# Patient Record
Sex: Male | Born: 1935 | Race: White | Hispanic: No | Marital: Married | Smoking: Former smoker
Health system: Southern US, Community
[De-identification: ages and names within clinical notes are randomized; demographics above are authoritative.]

## PROBLEM LIST (undated history)

## (undated) DIAGNOSIS — M1990 Unspecified osteoarthritis, unspecified site: Secondary | ICD-10-CM

## (undated) DIAGNOSIS — E7800 Pure hypercholesterolemia, unspecified: Secondary | ICD-10-CM

## (undated) DIAGNOSIS — K219 Gastro-esophageal reflux disease without esophagitis: Secondary | ICD-10-CM

## (undated) DIAGNOSIS — I10 Essential (primary) hypertension: Secondary | ICD-10-CM

## (undated) DIAGNOSIS — I639 Cerebral infarction, unspecified: Secondary | ICD-10-CM

## (undated) HISTORY — PX: EYE SURGERY: SHX253

## (undated) HISTORY — PX: COLONOSCOPY: SHX174

## (undated) HISTORY — PX: TONSILLECTOMY: SUR1361

---

## 1997-12-05 ENCOUNTER — Inpatient Hospital Stay (HOSPITAL_COMMUNITY): Admission: EM | Admit: 1997-12-05 | Discharge: 1997-12-07 | Payer: Self-pay | Admitting: Emergency Medicine

## 2008-08-09 ENCOUNTER — Ambulatory Visit: Payer: Self-pay | Admitting: Unknown Physician Specialty

## 2011-06-24 ENCOUNTER — Emergency Department (HOSPITAL_COMMUNITY)
Admission: EM | Admit: 2011-06-24 | Discharge: 2011-06-25 | Disposition: A | Discharge status: Home / Self Care | Payer: Medicare HMO | Attending: Emergency Medicine | Admitting: Emergency Medicine

## 2011-06-24 ENCOUNTER — Emergency Department (HOSPITAL_COMMUNITY): Payer: Medicare HMO

## 2011-06-24 ENCOUNTER — Encounter (HOSPITAL_COMMUNITY): Payer: Self-pay

## 2011-06-24 DIAGNOSIS — H538 Other visual disturbances: Secondary | ICD-10-CM | POA: Insufficient documentation

## 2011-06-24 DIAGNOSIS — Z79899 Other long term (current) drug therapy: Secondary | ICD-10-CM | POA: Insufficient documentation

## 2011-06-24 DIAGNOSIS — R209 Unspecified disturbances of skin sensation: Secondary | ICD-10-CM | POA: Insufficient documentation

## 2011-06-24 DIAGNOSIS — R200 Anesthesia of skin: Secondary | ICD-10-CM

## 2011-06-24 DIAGNOSIS — I10 Essential (primary) hypertension: Secondary | ICD-10-CM | POA: Insufficient documentation

## 2011-06-24 DIAGNOSIS — Z7982 Long term (current) use of aspirin: Secondary | ICD-10-CM | POA: Insufficient documentation

## 2011-06-24 HISTORY — DX: Essential (primary) hypertension: I10

## 2011-06-24 LAB — POCT I-STAT, CHEM 8
BUN: 30 mg/dL — ABNORMAL HIGH (ref 6–23)
Creatinine, Ser: 1.5 mg/dL — ABNORMAL HIGH (ref 0.50–1.35)
Hemoglobin: 12.9 g/dL — ABNORMAL LOW (ref 13.0–17.0)
Potassium: 4.5 mEq/L (ref 3.5–5.1)
Sodium: 144 mEq/L (ref 135–145)
TCO2: 26 mmol/L (ref 0–100)

## 2011-06-24 NOTE — ED Notes (Signed)
Pt from home with complaints of left arm numbness and tingling lasting 1 minute or less. Pt states dizziness and generalized weakness lasting appx 2 minutes with both episodes happens at the same time. Pt states having blurred vision every morning lasting 30 secs x last 2-3 days. Pt denies any pain, numbness, tingling, shortness of breath, dizziness or blurred vision at this time.

## 2011-06-24 NOTE — ED Provider Notes (Addendum)
History     CSN: [id number]  Arrival date & time 06/24/11  2126   First MD Initiated Contact with Patient 06/24/11 2140      Chief Complaint  Patient presents with  . Numbness    Left arm numbness and tingling     HPI Pt from home with complaints of left arm numbness and tingling lasting 1 minute or less. Pt states dizziness and generalized weakness lasting appx 2 minutes with both episodes happens at the same time. Pt states having blurred vision every morning lasting 30 secs x last 2-3 days. Pt denies any pain, numbness, tingling, shortness of breath, dizziness or blurred vision at this time  Past Medical History  Diagnosis Date  . Hypertension     No past surgical history on file.  No family history on file.  History  Substance Use Topics  . Smoking status: Not on file  . Smokeless tobacco: Not on file  . Alcohol Use:       Review of Systems  All other systems reviewed and are negative.    Allergies  Review of patient's allergies indicates no known allergies.  Home Medications   Current Outpatient Rx  Name Route Sig Dispense Refill  . RISAQUAD PO CAPS Oral Take 1 capsule by mouth daily.    Marland Kitchen AMLODIPINE BESYLATE 5 MG PO TABS Oral Take 5 mg by mouth daily.    . ASPIRIN 325 MG PO TABS Oral Take 650 mg by mouth once.    Marland Kitchen LORATADINE 10 MG PO TABS Oral Take 10 mg by mouth daily.    Marland Kitchen OMEPRAZOLE 20 MG PO CPDR Oral Take 20 mg by mouth daily.      BP 152/67  Pulse 74  Temp(Src) 98.7 F (37.1 C) (Oral)  Resp 23  SpO2 97%  Physical Exam  Nursing note and vitals reviewed. Constitutional: He is oriented to person, place, and time. He appears well-developed and well-nourished. No distress.  HENT:  Head: Normocephalic and atraumatic.  Eyes: Pupils are equal, round, and reactive to light.  Neck: Normal range of motion.  Cardiovascular: Normal rate, normal heart sounds and intact distal pulses.   No murmur heard.      Normal sinus rhythm Rate = 68 Axis is  normal QTC 417 Interpretation: Normal EKG  Pulmonary/Chest: No respiratory distress.  Abdominal: Normal appearance. He exhibits no distension.  Musculoskeletal: Normal range of motion.  Neurological: He is alert and oriented to person, place, and time. He has normal strength. No cranial nerve deficit or sensory deficit. Coordination normal. GCS eye subscore is 4. GCS verbal subscore is 5. GCS motor subscore is 6.  Skin: Skin is warm and dry. No rash noted.  Psychiatric: He has a normal mood and affect. His behavior is normal.    ED Course  Procedures (including critical care time)  Labs Reviewed  POCT I-STAT, CHEM 8 - Abnormal; Notable for the following:    BUN 30 (*)    Creatinine, Ser 1.50 (*)    Glucose, Bld 109 (*)    Hemoglobin 12.9 (*)    HCT 38.0 (*)    All other components within normal limits   Ct Head Wo Contrast  06/24/2011  *RADIOLOGY REPORT*  Clinical Data: Left-sided weakness, head pain.  CT HEAD WITHOUT CONTRAST  Technique:  Contiguous axial images were obtained from the base of the skull through the vertex without contrast.  Comparison: None.  Findings: Prominence of the sulci, cisterns, and ventricles, in keeping with volume  loss. There are subcortical and periventricular white matter hypodensities, a nonspecific finding most often seen with chronic microangiopathic changes.  There is no evidence for acute hemorrhage, overt hydrocephalus, mass lesion, or abnormal extra-axial fluid collection.  No definite CT evidence for acute cortical based (large artery) infarction. The visualized paranasal sinuses and mastoid air cells are predominately clear.  IMPRESSION: Volume loss and mild white matter changes.  No definite acute intracranial abnormality.  Original Report Authenticated By: Waneta Martins, M.D.     1. Arm numbness       MDM  After treatment in the ED the patient feels back to baseline and wants to go home.         Nelia Shi, MD 06/24/11  1914  Nelia Shi, MD 06/24/11 [phone]

## 2011-06-24 NOTE — Discharge Instructions (Signed)
Paresthesia Paresthesia is a burning or prickling feeling. This feeling can happen in any part of the body. It often happens in the hands, arms, legs, or feet. HOME CARE  Avoid drinking alcohol.   Try massage or needle therapy (acupuncture) to help with your problems.   Keep all doctor visits as told.  GET HELP RIGHT AWAY IF:   You feel weak.   You have trouble walking or moving.   You have problems speaking or seeing.   You feel confused.   You cannot control when you poop (bowel movement) or pee (urinate).   You lose feeling (numbness) after an injury.   You pass out (faint).   Your burning or prickling feeling gets worse when you walk.   You have pain, cramps, or feel dizzy.   You have a rash.  MAKE SURE YOU:   Understand these instructions.   Will watch your condition.   Will get help right away if you are not doing well or get worse.  Document Released: 12/13/2007 Document Revised: 12/19/2010 Document Reviewed: 09/20/2010 [employer] Patient Information 2012 [employer]. 

## 2011-08-14 ENCOUNTER — Inpatient Hospital Stay: Payer: Self-pay | Admitting: Internal Medicine

## 2011-08-14 LAB — CBC
HCT: 38.9 % — ABNORMAL LOW (ref 40.0–52.0)
HGB: 13 g/dL (ref 13.0–18.0)
MCHC: 33.4 g/dL (ref 32.0–36.0)
MCV: 96 fL (ref 80–100)
RDW: 14.4 % (ref 11.5–14.5)

## 2011-08-14 LAB — MAGNESIUM: Magnesium: 2 mg/dL

## 2011-08-14 LAB — COMPREHENSIVE METABOLIC PANEL
Alkaline Phosphatase: 109 U/L (ref 50–136)
Calcium, Total: 8.9 mg/dL (ref 8.5–10.1)
Co2: 28 mmol/L (ref 21–32)
EGFR (Non-African Amer.): 51 — ABNORMAL LOW
Osmolality: 284 (ref 275–301)
Sodium: 140 mmol/L (ref 136–145)

## 2011-08-14 LAB — APTT: Activated PTT: 32.6 secs (ref 23.6–35.9)

## 2011-08-14 LAB — TROPONIN I: Troponin-I: <0.02 ng/mL

## 2011-08-15 LAB — CBC WITH DIFFERENTIAL/PLATELET
Basophil #: 0.1 10*3/uL (ref 0.0–0.1)
Eosinophil #: 0.2 10*3/uL (ref 0.0–0.7)
Lymphocyte #: 2.4 10*3/uL (ref 1.0–3.6)
MCH: 32.6 pg (ref 26.0–34.0)
MCV: 95 fL (ref 80–100)
Monocyte #: 0.5 x10 3/mm (ref 0.2–1.0)
Monocyte %: 9.2 %
Platelet: 232 10*3/uL (ref 150–440)
RDW: 14.8 % — ABNORMAL HIGH (ref 11.5–14.5)
WBC: 5.7 10*3/uL (ref 3.8–10.6)

## 2011-08-15 LAB — LIPID PANEL
Cholesterol: 167 mg/dL (ref 0–200)
HDL Cholesterol: 56 mg/dL (ref 40–60)
VLDL Cholesterol, Calc: 20 mg/dL (ref 5–40)

## 2011-08-15 LAB — BASIC METABOLIC PANEL
BUN: 18 mg/dL (ref 7–18)
Creatinine: 1.28 mg/dL (ref 0.60–1.30)
EGFR (African American): >60
Sodium: 141 mmol/L (ref 136–145)

## 2013-07-22 IMAGING — US US CAROTID DUPLEX BILAT
1 series · 14 of 24 positions shown · non-contrast
Comparison: none

REASON FOR EXAM: acute cva
COMMENTS:

[Series 1: us carotid duplex bilat · 0.07mm/px · 14 of 65 slices shown]
[im 1/65]
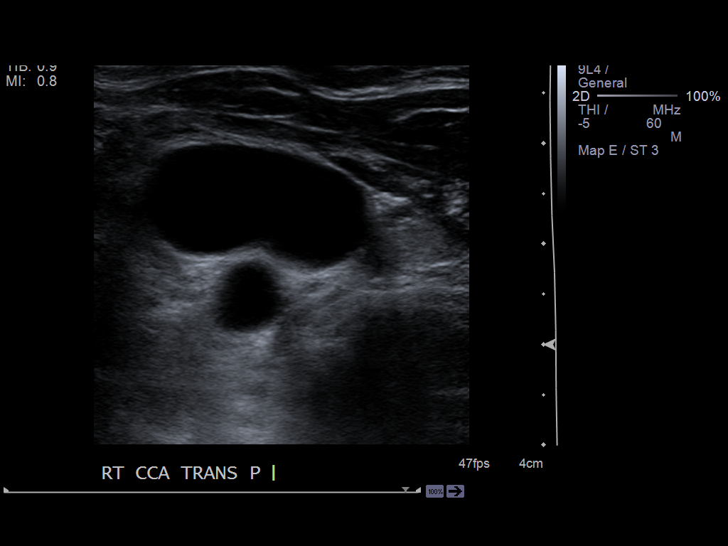
[im 6/65]
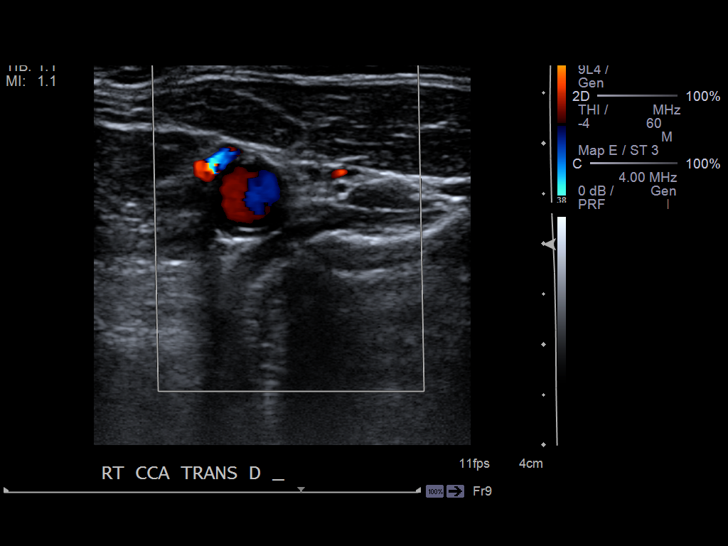
[im 12/65]
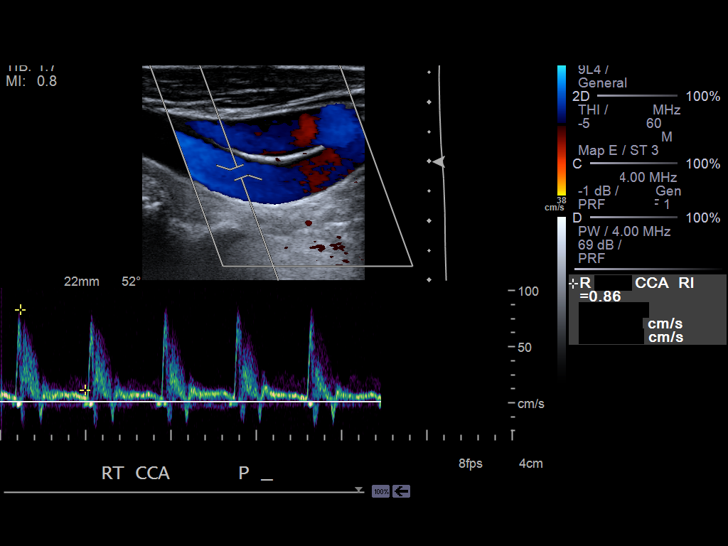
[im 17/65]
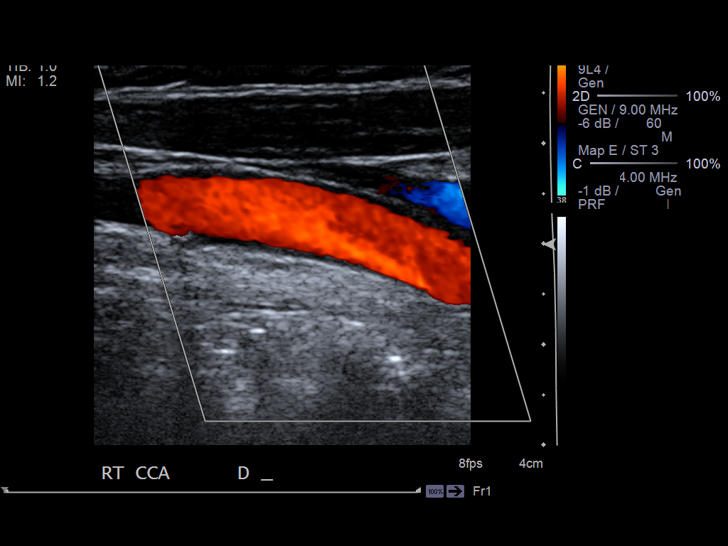
[im 20/65]
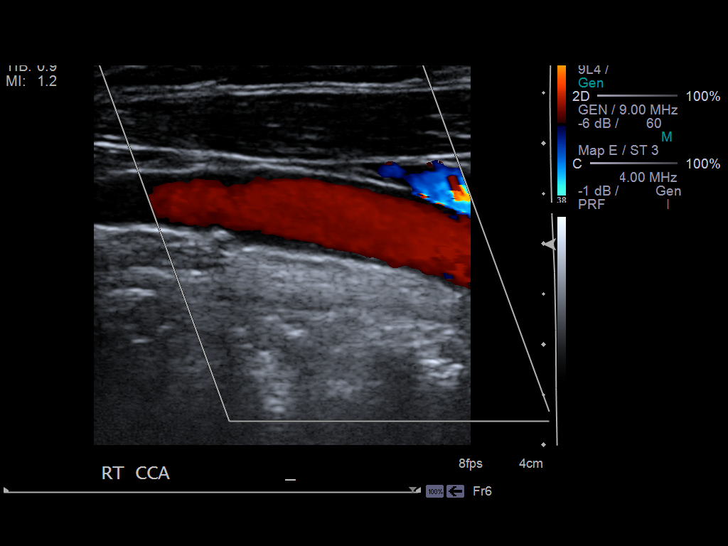
[im 26/65]
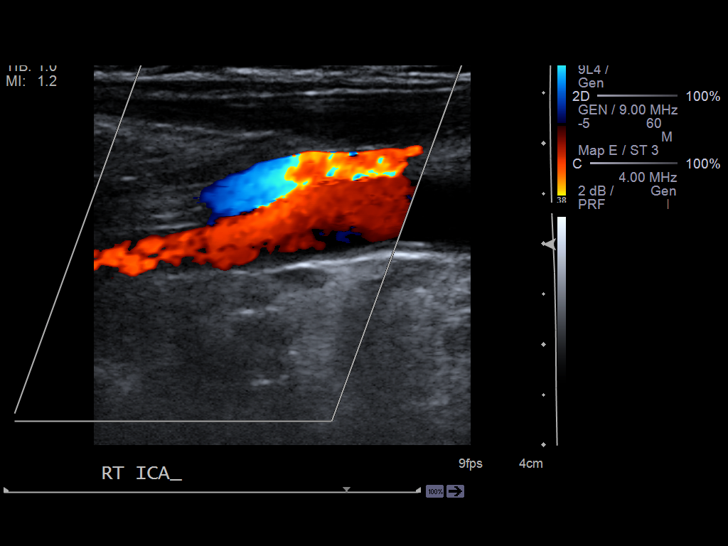
[im 31/65]
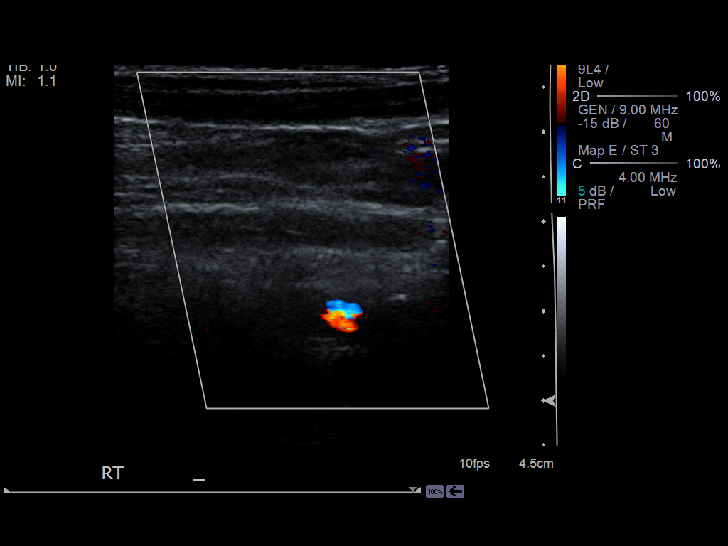
[im 34/65]
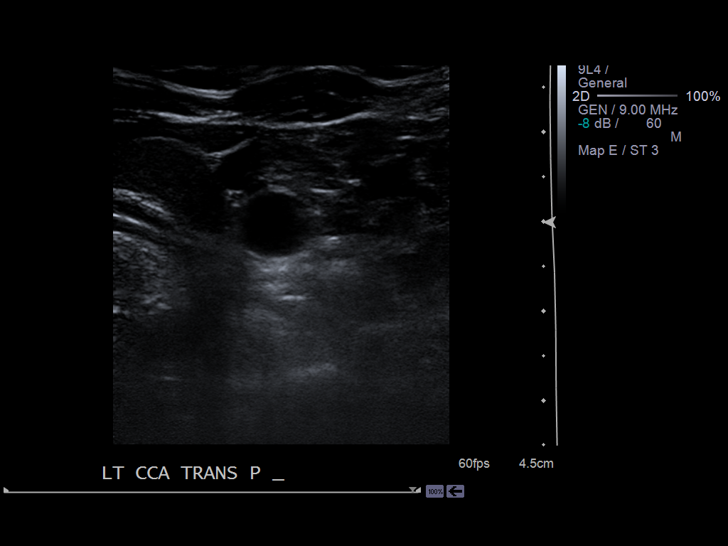
[im 39/65]
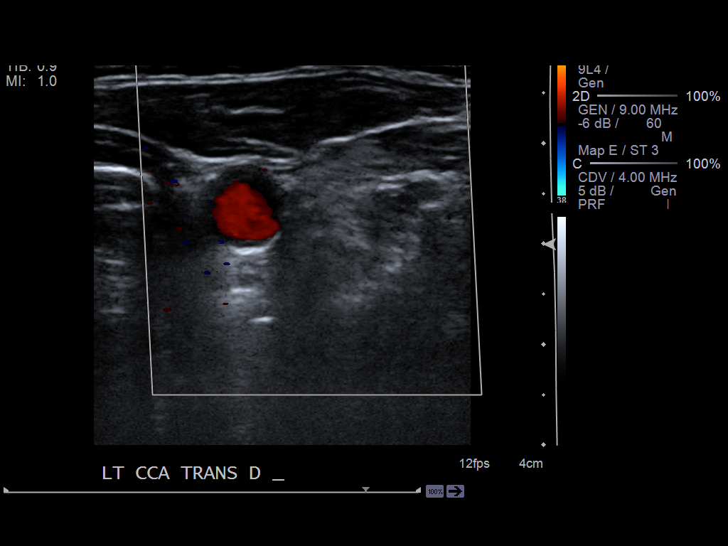
[im 45/65]
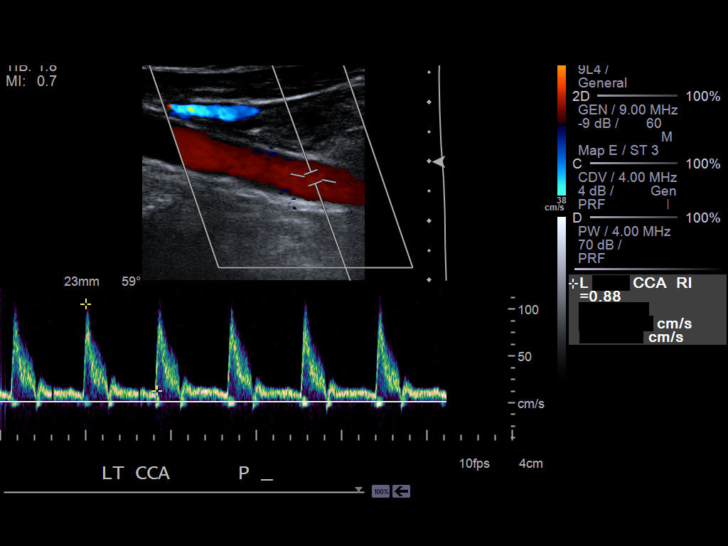
[im 51/65]
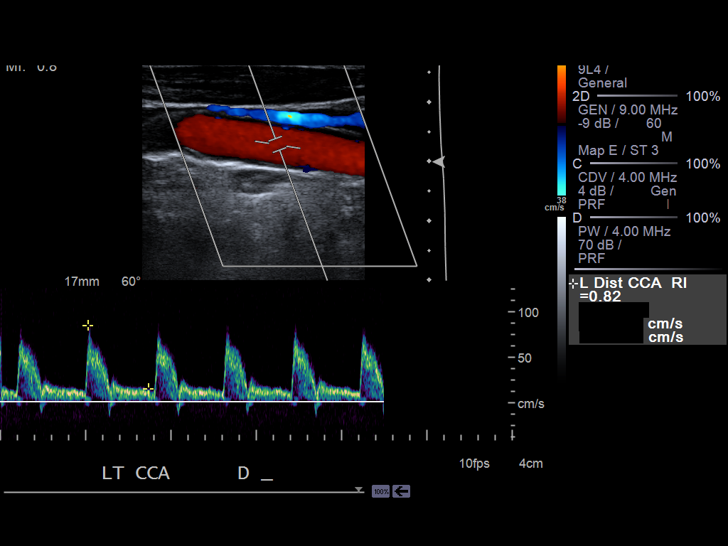
[im 53/65]
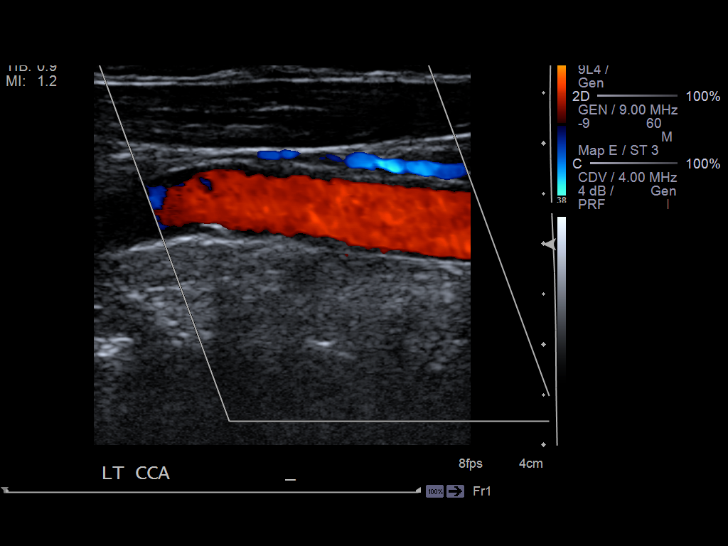
[im 59/65]
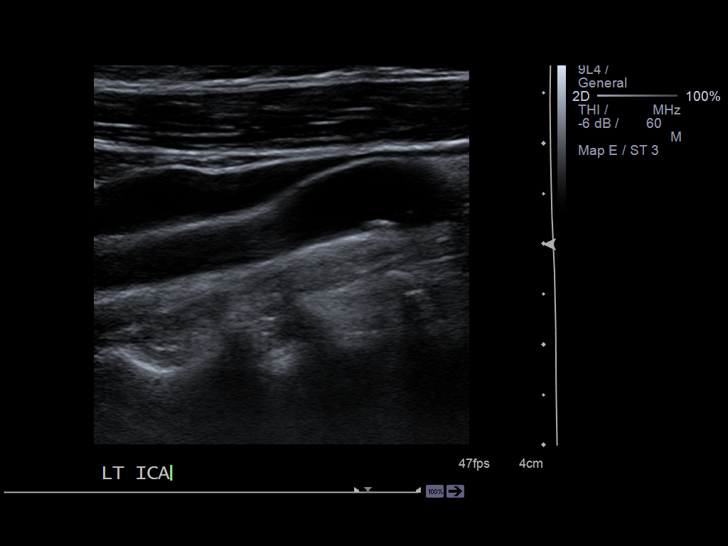
[im 65/65]
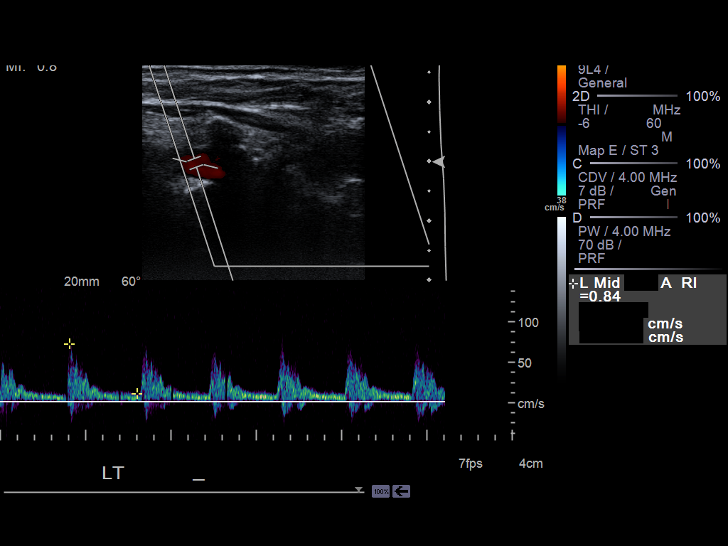

[14 of 24 positions shown; findings below may reference images not displayed]

PROCEDURE:     US  - US CAROTID DOPPLER BILATERAL  - August 14, 2011  [DATE]

RESULT:     Bilateral carotid Doppler duplex Doppler reveals mild calcified
plaque both carotid bifurcations with the peak systolic systolic flow
velocity ratio right 0.8, on the left 1.1. Vertebrals are patent with
antegrade flow.
IMPRESSION: No hemodynamically significant stenosis.

 Dictation site 2

## 2014-05-02 NOTE — H&P (Signed)
PATIENT NAME:  Arleta CreekGARNER, Penn K MR#:  [id number] DATE OF BIRTH:  1929/05/20  DATE OF ADMISSION:  08/14/2011  REFERRING PHYSICIAN: ER physician, Dr. Enedina FinnerGoli PRIMARY CARE PHYSICIAN: Dr. Daniel NonesBert Klein  CHIEF COMPLAINT: Left upper extremity numbness.   HISTORY OF PRESENT ILLNESS: Patient is an 81 year old Caucasian male with past medical history of hypertension who on 06/11 felt that his left hand had gone numb. He was having recurrent episodes of numbness which lasted for about 30 seconds to a minute therefore [facility]. He had a CT of the head at that time and was told that no abnormality was detected. Subsequently he was discharged home. For the last one month patient did not have any additional symptoms, however, he reports that in the last few days he has been having intermittent numbness of his fingers. He came to the ER today because the tingling and numbness in his left hand has become permanent. He feels that his left hand has "gone to sleep". He also felt that the left side of his face felt funny and his left leg felt funny. Patient came to the ER and an MRI of the brain shows and acute right thalamic infarct. Patient is being admitted for further management.   PAST MEDICAL HISTORY: Hypertension.   PAST SURGICAL HISTORY: None.   ALLERGIES: None.   MEDICATIONS: Norvasc 5 mg daily.   SOCIAL HISTORY: Denies any history of smoking or drug abuse. He lives alone, is a widower. He drinks a large glass of wine every night with dinner.   FAMILY HISTORY: Mother died of a motor vehicle accident. Father and brother had Parkinson's disease. Sister recently died at age 79 of old age.   REVIEW OF SYSTEMS: CONSTITUTIONAL: Denies any fever, fatigue, weakness. EYES: Denies any blurred or double vision. ENT: Denies any tinnitus, ear pain. RESPIRATORY: Denies any cough, wheezing. CARDIOVASCULAR: Denies any chest pain, palpitations. GASTROINTESTINAL: Denies any nausea, vomiting, abdominal  pain. GENITOURINARY: Denies any dysuria, hematuria. ENDO: Denies any polyuria, nocturia. HEME/LYMPH: Denies any anemia, easy bruisability. INTEGUMENT: Denies any acne, rash. MUSCULOSKELETAL: Denies any swelling, gout. NEUROLOGICAL: Reports numbness and tingling in his left hand, left side of the face and left lower extremity. PSYCH: Denies any anxiety or nervousness.   PHYSICAL EXAMINATION:  VITAL SIGNS: Temperature 97.8, heart rate 77, respiratory rate 16, blood pressure 141/70, pulse ox 100% on room air.   GENERAL: Patient is an 81 year old Caucasian male sitting comfortably in bed, not in acute distress.   HEAD: Atraumatic, normocephalic.   EYES: No pallor, icterus, or cyanosis. Pupils equal, round, and reactive to light and accommodation. Extraocular movements intact.   ENT: Wet mucous membranes. No oropharyngeal erythema or thrush.   NECK: Supple. No masses. No JVD. No thyromegaly or lymphadenopathy.   CHEST WALL: No tenderness to palpation. Not using accessory muscles of respiration. No intercostal muscle retractions.   LUNGS: Bilaterally clear to auscultation. No wheezing, rales or rhonchi.   CARDIOVASCULAR: S1, S2, regular. No murmurs, rubs, or gallops.   ABDOMEN: Soft, nontender, nondistended. No guarding. No rigidity. No organomegaly. Normal bowel sounds.   SKIN: No rashes or lesions.   PERIPHERIES: No pedal edema. 2+ pedal pulses.   MUSCULOSKELETAL: No cyanosis, clubbing.   NEUROLOGICAL: Awake, alert, oriented x3. Motor strength is 5/5 in all extremities. On sensory testing patient reports that he feels as though his left hand feels tingling and has less sensation. The left side of his face and his left lower extremity also feel  slightly different than the right extremity.   PSYCH: Normal mood and affect.   LABORATORY, DIAGNOSTIC, AND RADIOLOGICAL DATA: MRI of the brain shows acute nonhemorrhagic right thalamic lacunar infarct. CBC with WBC 6.1, hemoglobin 13, hematocrit  38.9, platelet count 254, glucose 105, BUN 24, creatinine 1.31, rest of complete metabolic panel is normal. Cardiac enzymes negative.   ASSESSMENT AND PLAN: 81 year old male with past medical history of hypertension presents with left upper extremity numbness, tingling. 1. Acute cerebrovascular accident. MRI of the brain shows acute nonhemorrhagic right thalamic lacunar infarct. Will admit the patient to the hospital. Start him on a full dose aspirin. Will check a fasting lipid profile and also start him on Lipitor. Will obtain PT and OT consultation. Will check a 2-D echo and carotid ultrasound.  2. Hypertension. Appears to be well controlled at present. Will continue Norvasc 5 mg daily and avoid hypotension.  3. Mild hyperglycemia, possibly reactive. Patient has no history of diabetes.  4. Elevated BUN and creatinine, possible chronic kidney disease. There are no old labs available for comparison. Will hydrate gently with IV fluids and recheck in a.m.  5. Will place on GI and deep venous thrombosis prophylaxis .  Reviewed old records, discussed with the patient, discussed with the ED physician.   TIME SPENT: 75 minutes.   ____________________________ Darrick Meigs, MD sp:cms D: 08/14/2011 14:30:03 ET T: 08/14/2011 14:51:19 ET JOB#: [id number]  cc: Darrick Meigs, MD, <Dictator> Lynnea Ferrier, MD Darrick Meigs MD ELECTRONICALLY SIGNED 08/14/2011 16:19

## 2014-05-02 NOTE — Discharge Summary (Signed)
PATIENT NAME:  Noah Boone, Noah Boone MR#:  [id number] DATE OF BIRTH:  July 04, 1929  DATE OF ADMISSION:  08/14/2011 DATE OF DISCHARGE:  08/15/2011  FINAL DIAGNOSES:  1. Acute right thalamic cerebrovascular accident.  2. Hypertension.  3. Gastroesophageal reflux disease.   HISTORY AND PHYSICAL: Please see dictated admission history and physical.   SUMMARY OF HOSPITAL COURSE: The patient was admitted with new onset of left facial, arm, and leg numbness, without significant weakness. He had MRI performed in the Emergency Room, which confirmed the above findings. His blood pressure was controlled throughout his hospitalization. Cholesterol was obtained, with LDL less than 100. He was placed on aspirin 325 mg daily. He had some stuttering symptoms similar to this previously at [facility], however had not continued to use the aspirin that had been recommended. Over the course of his hospitalization his symptoms actually improved, with some residual numbness mainly in his hands. Physical therapy was ordered for the patient, however, the patient was ambulating well enough that he felt like he did not need this at all, and so he declined physical therapy or further rehab services.   At this time, the patient is ambulating independently and will be discharged to home in stable condition with his physical activity to be up as tolerated. He should follow a 2 gram sodium diet and he will follow-up in our office within the next two weeks.   DISCHARGE MEDICATIONS:  1. Amlodipine 5 mg p.o. daily.          2. Aspirin 325 mg daily, to be taken with food.  3. Pantoprazole 40 mg p.o. daily.  ____________________________ Lynnea FerrierBert J. Eliel Dudding III, MD bjk:slb D: 08/15/2011 13:24:29 ET T: 08/15/2011 14:40:30 ET JOB#: [id number]  cc: Lynnea FerrierBert J. Floy Angert III, MD, <Dictator> Daniel NonesBERT Lillith Mcneff MD ELECTRONICALLY SIGNED 08/18/2011 12:41

## 2016-08-13 ENCOUNTER — Encounter: Payer: Self-pay | Admitting: *Deleted

## 2016-08-19 ENCOUNTER — Ambulatory Visit: Payer: Medicare PPO | Admitting: Anesthesiology

## 2016-08-19 ENCOUNTER — Encounter: Payer: Self-pay | Admitting: *Deleted

## 2016-08-19 ENCOUNTER — Ambulatory Visit
Admission: RE | Admit: 2016-08-19 | Discharge: 2016-08-19 | Disposition: A | Discharge status: Home / Self Care | Payer: Medicare PPO | Source: Ambulatory Visit | Attending: Ophthalmology | Admitting: Ophthalmology

## 2016-08-19 ENCOUNTER — Encounter
Admission: RE | Disposition: A | Discharge status: Home / Self Care | Payer: Self-pay | Source: Ambulatory Visit | Attending: Ophthalmology

## 2016-08-19 DIAGNOSIS — Z79899 Other long term (current) drug therapy: Secondary | ICD-10-CM | POA: Diagnosis not present

## 2016-08-19 DIAGNOSIS — I2510 Atherosclerotic heart disease of native coronary artery without angina pectoris: Secondary | ICD-10-CM | POA: Insufficient documentation

## 2016-08-19 DIAGNOSIS — K219 Gastro-esophageal reflux disease without esophagitis: Secondary | ICD-10-CM | POA: Diagnosis not present

## 2016-08-19 DIAGNOSIS — I10 Essential (primary) hypertension: Secondary | ICD-10-CM | POA: Insufficient documentation

## 2016-08-19 DIAGNOSIS — Z87891 Personal history of nicotine dependence: Secondary | ICD-10-CM | POA: Insufficient documentation

## 2016-08-19 DIAGNOSIS — E7800 Pure hypercholesterolemia, unspecified: Secondary | ICD-10-CM | POA: Insufficient documentation

## 2016-08-19 DIAGNOSIS — Z7902 Long term (current) use of antithrombotics/antiplatelets: Secondary | ICD-10-CM | POA: Diagnosis not present

## 2016-08-19 DIAGNOSIS — M1990 Unspecified osteoarthritis, unspecified site: Secondary | ICD-10-CM | POA: Diagnosis not present

## 2016-08-19 DIAGNOSIS — Z7982 Long term (current) use of aspirin: Secondary | ICD-10-CM | POA: Insufficient documentation

## 2016-08-19 DIAGNOSIS — H2512 Age-related nuclear cataract, left eye: Secondary | ICD-10-CM | POA: Insufficient documentation

## 2016-08-19 HISTORY — PX: CATARACT EXTRACTION W/PHACO: SHX586

## 2016-08-19 HISTORY — DX: Cerebral infarction, unspecified: I63.9

## 2016-08-19 HISTORY — DX: Gastro-esophageal reflux disease without esophagitis: K21.9

## 2016-08-19 SURGERY — PHACOEMULSIFICATION, CATARACT, WITH IOL INSERTION
Anesthesia: Monitor Anesthesia Care | Site: Eye | Laterality: Left | Wound class: Clean

## 2016-08-19 MED ORDER — MOXIFLOXACIN HCL 0.5 % OP SOLN
OPHTHALMIC | Status: DC | PRN
  Administered 2016-08-19: .2 mL via OPHTHALMIC

## 2016-08-19 MED ORDER — POVIDONE-IODINE 5 % OP SOLN
OPHTHALMIC | Status: DC | PRN
  Administered 2016-08-19: 1 via OPHTHALMIC

## 2016-08-19 MED ORDER — ARMC OPHTHALMIC DILATING DROPS
OPHTHALMIC | Status: AC
  Filled 2016-08-19: qty 0.4

## 2016-08-19 MED ORDER — MOXIFLOXACIN HCL 0.5 % OP SOLN
OPHTHALMIC | Status: AC
  Filled 2016-08-19: qty 3

## 2016-08-19 MED ORDER — SODIUM CHLORIDE 0.9 % IV SOLN
INTRAVENOUS | Status: DC
  Administered 2016-08-19: 08:00:00 via INTRAVENOUS

## 2016-08-19 MED ORDER — ARMC OPHTHALMIC DILATING DROPS
1.0000 "application " | OPHTHALMIC | Status: AC
  Administered 2016-08-19 (×3): 1 via OPHTHALMIC

## 2016-08-19 MED ORDER — BSS IO SOLN
INTRAOCULAR | Status: DC | PRN
  Administered 2016-08-19: 2 mL via OPHTHALMIC

## 2016-08-19 MED ORDER — EPINEPHRINE PF 1 MG/ML IJ SOLN
INTRAOCULAR | Status: DC | PRN
  Administered 2016-08-19: 1 mL via OPHTHALMIC

## 2016-08-19 MED ORDER — NA CHONDROIT SULF-NA HYALURON 40-17 MG/ML IO SOLN
INTRAOCULAR | Status: DC | PRN
  Administered 2016-08-19: 1 mL via INTRAOCULAR

## 2016-08-19 MED ORDER — CARBACHOL 0.01 % IO SOLN
INTRAOCULAR | Status: DC | PRN
  Administered 2016-08-19: .5 mL via INTRAOCULAR

## 2016-08-19 MED ORDER — MOXIFLOXACIN HCL 0.5 % OP SOLN: 1.0000 [drp] | OPHTHALMIC | Status: DC | PRN

## 2016-08-19 SURGICAL SUPPLY — 16 items
GLOVE BIO SURGEON STRL SZ8 (GLOVE) ×2 IMPLANT
GLOVE BIOGEL M 6.5 STRL (GLOVE) ×2 IMPLANT
GLOVE SURG LX 8.0 MICRO (GLOVE) ×1
GLOVE SURG LX STRL 8.0 MICRO (GLOVE) ×1 IMPLANT
GOWN STRL REUS W/ TWL LRG LVL3 (GOWN DISPOSABLE) ×2 IMPLANT
GOWN STRL REUS W/TWL LRG LVL3 (GOWN DISPOSABLE) ×2
LABEL CATARACT MEDS ST (LABEL) ×2 IMPLANT
LENS IOL TECNIS ITEC 20.5 (Intraocular Lens) ×2 IMPLANT
PACK CATARACT (MISCELLANEOUS) ×2 IMPLANT
PACK CATARACT BRASINGTON LX (MISCELLANEOUS) ×2 IMPLANT
PACK EYE AFTER SURG (MISCELLANEOUS) ×2 IMPLANT
SOL BSS BAG (MISCELLANEOUS) ×2
SOLUTION BSS BAG (MISCELLANEOUS) ×1 IMPLANT
SYR 5ML LL (SYRINGE) ×2 IMPLANT
WATER STERILE IRR 250ML POUR (IV SOLUTION) ×2 IMPLANT
WIPE NON LINTING 3.25X3.25 (MISCELLANEOUS) ×2 IMPLANT

## 2016-08-19 NOTE — Transfer of Care (Signed)
Immediate Anesthesia Transfer of Care Note  Patient: Noah Boone  Procedure(s) Performed: Procedure(s) with comments: CATARACT EXTRACTION PHACO AND INTRAOCULAR LENS PLACEMENT (IOC) (Left) - [place] 00:59 AP% 15.5 CDE 9.23 fluid pack lot # [id number]  Patient Location: Short Stay  Anesthesia Type:MAC  Level of Consciousness: awake, alert  and oriented  Airway & Oxygen Therapy: Patient Spontanous Breathing  Post-op Assessment: Report given to RN  Post vital signs: stable  Last Vitals:  Vitals:   08/19/16 0803 08/19/16 0938  BP: (!) 166/71 (!) 154/72  Pulse: 67 76  Resp: 16 12  Temp: 36.9 C 36.8 C    Last Pain:  Vitals:   08/19/16 0938  TempSrc: Oral         Complications: No apparent anesthesia complications

## 2016-08-19 NOTE — Anesthesia Preprocedure Evaluation (Signed)
Anesthesia Evaluation  Patient identified by MRN, date of birth, ID band Patient awake    Reviewed: Allergy & Precautions, H&P , NPO status , Patient's Chart, lab work & pertinent test results  History of Anesthesia Complications Negative for: history of anesthetic complications  Airway Mallampati: III  TM Distance: <3 FB Neck ROM: limited    Dental  (+) Poor Dentition, Chipped, Missing   Pulmonary neg shortness of breath, former smoker,           Cardiovascular Exercise Tolerance: Good hypertension, (-) angina(-) Past MI and (-) DOE      Neuro/Psych CVA, Residual Symptoms negative psych ROS   GI/Hepatic Neg liver ROS, GERD  Controlled,  Endo/Other  negative endocrine ROS  Renal/GU      Musculoskeletal   Abdominal   Peds  Hematology negative hematology ROS (+)   Anesthesia Other Findings Past Medical History: No date: GERD (gastroesophageal reflux disease) No date: Hypertension No date: Stroke [facility])  Past Surgical History: No date: COLONOSCOPY No date: TONSILLECTOMY  BMI    Body Mass Index:  23.01 kg/m      Reproductive/Obstetrics negative OB ROS                             Anesthesia Physical Anesthesia Plan  ASA: III  Anesthesia Plan: MAC   Post-op Pain Management:    Induction: Intravenous  PONV Risk Score and Plan:   Airway Management Planned: Natural Airway and Nasal Cannula  Additional Equipment:   Intra-op Plan:   Post-operative Plan:   Informed Consent: I have reviewed the patients History and Physical, chart, labs and discussed the procedure including the risks, benefits and alternatives for the proposed anesthesia with the patient or authorized representative who has indicated his/her understanding and acceptance.   Dental Advisory Given  Plan Discussed with: Anesthesiologist, CRNA and Surgeon  Anesthesia Plan Comments: (Patient consented for risks  of anesthesia including but not limited to:  - adverse reactions to medications - damage to teeth, lips or other oral mucosa - sore throat or hoarseness - Damage to heart, brain, lungs or loss of life  Patient voiced understanding.)        Anesthesia Quick Evaluation

## 2016-08-19 NOTE — H&P (Signed)
All labs reviewed. Abnormal studies sent to patients PCP when indicated.  Previous H&P reviewed, patient examined, there are NO CHANGES.  Noah Boone LOUIS8/7/20189:06 AM

## 2016-08-19 NOTE — Anesthesia Post-op Follow-up Note (Signed)
Anesthesia QCDR form completed.        

## 2016-08-19 NOTE — Anesthesia Postprocedure Evaluation (Signed)
Anesthesia Post Note  Patient: Noah Boone  Procedure(s) Performed: Procedure(s) (LRB): CATARACT EXTRACTION PHACO AND INTRAOCULAR LENS PLACEMENT (IOC) (Left)  Patient location during evaluation: Short Stay Anesthesia Type: MAC Level of consciousness: awake and alert Pain management: pain level controlled Vital Signs Assessment: post-procedure vital signs reviewed and stable Respiratory status: spontaneous breathing, nonlabored ventilation, respiratory function stable and patient connected to nasal cannula oxygen Cardiovascular status: stable and blood pressure returned to baseline Anesthetic complications: no     Last Vitals:  Vitals:   08/19/16 0803 08/19/16 0938  BP: (!) 166/71 (!) 154/72  Pulse: 67 76  Resp: 16 12  Temp: 36.9 C 36.8 C    Last Pain:  Vitals:   08/19/16 0938  TempSrc: Oral                 Lashonta Pilling,  Clearnce Sorrel

## 2016-08-19 NOTE — Op Note (Signed)
PREOPERATIVE DIAGNOSIS:  Nuclear sclerotic cataract of the left eye.   POSTOPERATIVE DIAGNOSIS:  Nuclear sclerotic cataract of the left eye.   OPERATIVE PROCEDURE: Procedure(s): CATARACT EXTRACTION PHACO AND INTRAOCULAR LENS PLACEMENT (IOC)   SURGEON:  Galen ManilaWilliam Martika Egler, MD.   ANESTHESIA:  Anesthesiologist: Piscitello, Cleda MccreedyJoseph K, MD CRNA: Irving BurtonBachich, Jennifer, CRNA  1.      Managed anesthesia care. 2.     0.201ml of Shugarcaine was instilled following the paracentesis   COMPLICATIONS:  None.   TECHNIQUE:   Stop and chop   DESCRIPTION OF PROCEDURE:  The patient was examined and consented in the preoperative holding area where the aforementioned topical anesthesia was applied to the left eye and then brought back to the Operating Room where the left eye was prepped and draped in the usual sterile ophthalmic fashion and a lid speculum was placed. A paracentesis was created with the side port blade and the anterior chamber was filled with viscoelastic. A near clear corneal incision was performed with the steel keratome. A continuous curvilinear capsulorrhexis was performed with a cystotome followed by the capsulorrhexis forceps. Hydrodissection and hydrodelineation were carried out with BSS on a blunt cannula. The lens was removed in a stop and chop  technique and the remaining cortical material was removed with the irrigation-aspiration handpiece. The capsular bag was inflated with viscoelastic and the Technis [id number] lens was placed in the capsular bag without complication. The remaining viscoelastic was removed from the eye with the irrigation-aspiration handpiece. The wounds were hydrated. The anterior chamber was flushed with Miostat and the eye was inflated to physiologic pressure. 0.701ml Vigamox was placed in the anterior chamber. The wounds were found to be water tight. The eye was dressed with Vigamox. The patient was given protective glasses to wear throughout the day and a shield with which to  sleep tonight. The patient was also given drops with which to begin a drop regimen today and will follow-up with me in one day.  Implant Name Type Inv. Item Serial No. Manufacturer Lot No. LRB No. Used  LENS IOL DIOP 20.5 - [id number] Intraocular Lens LENS IOL DIOP 20.5 [id number]   Left 1    Procedure(s) with comments: CATARACT EXTRACTION PHACO AND INTRAOCULAR LENS PLACEMENT (IOC) (Left) - US 00:59 AP% 15.5 CDE 9.23 fluid pack lot # [id number]  Electronically signed: Sahvannah Rieser LOUIS 08/19/2016 9:35 AM

## 2016-08-19 NOTE — Discharge Instructions (Signed)
Eye Surgery Discharge Instructions  Expect mild scratchy sensation or mild soreness. DO NOT RUB YOUR EYE!  The day of surgery:  Minimal physical activity, but bed rest is not required  No reading, computer work, or close hand work  No bending, lifting, or straining.  May watch TV  For 24 hours:  No driving, legal decisions, or alcoholic beverages  Safety precautions  Eat anything you prefer: It is better to start with liquids, then soup then solid foods.  _____ Eye patch should be worn until postoperative exam tomorrow.  ____ Solar shield eyeglasses should be worn for comfort in the sunlight/patch while sleeping  Resume all regular medications including aspirin or Coumadin if these were discontinued prior to surgery. You may shower, bathe, shave, or wash your hair. Tylenol may be taken for mild discomfort.  Call your doctor if you experience significant pain, nausea, or vomiting, fever > 101 or other signs of infection. [phone] or [phone] Specific instructions:  Follow-up Information    Noah Boone, William, MD Follow up.   Specialty:  Ophthalmology Why:  August 8 at 10:25am Contact information: [address] [place] [address] [phone]

## 2016-09-04 ENCOUNTER — Encounter: Payer: Self-pay | Admitting: *Deleted

## 2016-09-09 ENCOUNTER — Ambulatory Visit: Payer: Medicare PPO | Admitting: Registered Nurse

## 2016-09-09 ENCOUNTER — Encounter
Admission: RE | Disposition: A | Discharge status: Home / Self Care | Payer: Self-pay | Source: Ambulatory Visit | Attending: Ophthalmology

## 2016-09-09 ENCOUNTER — Ambulatory Visit
Admission: RE | Admit: 2016-09-09 | Discharge: 2016-09-09 | Disposition: A | Discharge status: Home / Self Care | Payer: Medicare PPO | Source: Ambulatory Visit | Attending: Ophthalmology | Admitting: Ophthalmology

## 2016-09-09 DIAGNOSIS — E7800 Pure hypercholesterolemia, unspecified: Secondary | ICD-10-CM | POA: Diagnosis not present

## 2016-09-09 DIAGNOSIS — I69354 Hemiplegia and hemiparesis following cerebral infarction affecting left non-dominant side: Secondary | ICD-10-CM | POA: Insufficient documentation

## 2016-09-09 DIAGNOSIS — Z79899 Other long term (current) drug therapy: Secondary | ICD-10-CM | POA: Insufficient documentation

## 2016-09-09 DIAGNOSIS — I10 Essential (primary) hypertension: Secondary | ICD-10-CM | POA: Insufficient documentation

## 2016-09-09 DIAGNOSIS — H2511 Age-related nuclear cataract, right eye: Secondary | ICD-10-CM | POA: Insufficient documentation

## 2016-09-09 DIAGNOSIS — K219 Gastro-esophageal reflux disease without esophagitis: Secondary | ICD-10-CM | POA: Diagnosis not present

## 2016-09-09 DIAGNOSIS — Z87891 Personal history of nicotine dependence: Secondary | ICD-10-CM | POA: Diagnosis not present

## 2016-09-09 HISTORY — PX: CATARACT EXTRACTION W/PHACO: SHX586

## 2016-09-09 HISTORY — DX: Pure hypercholesterolemia, unspecified: E78.00

## 2016-09-09 HISTORY — DX: Unspecified osteoarthritis, unspecified site: M19.90

## 2016-09-09 SURGERY — PHACOEMULSIFICATION, CATARACT, WITH IOL INSERTION
Anesthesia: Monitor Anesthesia Care | Site: Eye | Laterality: Right | Wound class: Clean

## 2016-09-09 MED ORDER — EPINEPHRINE PF 1 MG/ML IJ SOLN
INTRAOCULAR | Status: DC | PRN
  Administered 2016-09-09: 1 mL via OPHTHALMIC

## 2016-09-09 MED ORDER — POVIDONE-IODINE 5 % OP SOLN
OPHTHALMIC | Status: AC
  Filled 2016-09-09: qty 30

## 2016-09-09 MED ORDER — MIDAZOLAM HCL 2 MG/2ML IJ SOLN
INTRAMUSCULAR | Status: AC
Start: 2016-09-09 — End: 2016-09-09
  Filled 2016-09-09: qty 2

## 2016-09-09 MED ORDER — EPINEPHRINE PF 1 MG/ML IJ SOLN
INTRAMUSCULAR | Status: AC
  Filled 2016-09-09: qty 1

## 2016-09-09 MED ORDER — MOXIFLOXACIN HCL 0.5 % OP SOLN
OPHTHALMIC | Status: AC
  Filled 2016-09-09: qty 3

## 2016-09-09 MED ORDER — POVIDONE-IODINE 5 % OP SOLN
OPHTHALMIC | Status: DC | PRN
  Administered 2016-09-09: 1 via OPHTHALMIC

## 2016-09-09 MED ORDER — FENTANYL CITRATE (PF) 100 MCG/2ML IJ SOLN
INTRAMUSCULAR | Status: AC
  Filled 2016-09-09: qty 2

## 2016-09-09 MED ORDER — SODIUM CHLORIDE 0.9 % IV SOLN
INTRAVENOUS | Status: DC
  Administered 2016-09-09: 09:00:00 via INTRAVENOUS

## 2016-09-09 MED ORDER — ARMC OPHTHALMIC DILATING DROPS
OPHTHALMIC | Status: AC
  Administered 2016-09-09: 1 via OPHTHALMIC
  Filled 2016-09-09: qty 0.4

## 2016-09-09 MED ORDER — LIDOCAINE HCL (PF) 4 % IJ SOLN
INTRAMUSCULAR | Status: AC
  Filled 2016-09-09: qty 5

## 2016-09-09 MED ORDER — NA CHONDROIT SULF-NA HYALURON 40-17 MG/ML IO SOLN
INTRAOCULAR | Status: DC | PRN
  Administered 2016-09-09: 1 mL via INTRAOCULAR

## 2016-09-09 MED ORDER — MIDAZOLAM HCL 2 MG/2ML IJ SOLN
INTRAMUSCULAR | Status: DC | PRN
  Administered 2016-09-09: 1 mg via INTRAVENOUS

## 2016-09-09 MED ORDER — MOXIFLOXACIN HCL 0.5 % OP SOLN
OPHTHALMIC | Status: DC | PRN
  Administered 2016-09-09: .2 mL via OPHTHALMIC

## 2016-09-09 MED ORDER — NA CHONDROIT SULF-NA HYALURON 40-17 MG/ML IO SOLN
INTRAOCULAR | Status: AC
  Filled 2016-09-09: qty 1

## 2016-09-09 MED ORDER — CARBACHOL 0.01 % IO SOLN
INTRAOCULAR | Status: DC | PRN
  Administered 2016-09-09: .5 mL via INTRAOCULAR

## 2016-09-09 MED ORDER — MOXIFLOXACIN HCL 0.5 % OP SOLN: 1.0000 [drp] | Freq: Once | OPHTHALMIC | Status: DC

## 2016-09-09 MED ORDER — ARMC OPHTHALMIC DILATING DROPS
1.0000 "application " | Freq: Once | OPHTHALMIC | Status: AC
  Administered 2016-09-09 (×3): 1 via OPHTHALMIC

## 2016-09-09 MED ORDER — LIDOCAINE HCL (PF) 4 % IJ SOLN
INTRAOCULAR | Status: DC | PRN
  Administered 2016-09-09: 2 mL via OPHTHALMIC

## 2016-09-09 MED ORDER — FENTANYL CITRATE (PF) 100 MCG/2ML IJ SOLN
INTRAMUSCULAR | Status: DC | PRN
Start: 2016-09-09 — End: 2016-09-09
  Administered 2016-09-09: 25 ug via INTRAVENOUS

## 2016-09-09 SURGICAL SUPPLY — 16 items
GLOVE BIO SURGEON STRL SZ8 (GLOVE) ×3 IMPLANT
GLOVE BIOGEL M 6.5 STRL (GLOVE) ×3 IMPLANT
GLOVE SURG LX 8.0 MICRO (GLOVE) ×2
GLOVE SURG LX STRL 8.0 MICRO (GLOVE) ×1 IMPLANT
GOWN STRL REUS W/ TWL LRG LVL3 (GOWN DISPOSABLE) ×2 IMPLANT
GOWN STRL REUS W/TWL LRG LVL3 (GOWN DISPOSABLE) ×4
LABEL CATARACT MEDS ST (LABEL) ×3 IMPLANT
LENS IOL TECNIS ITEC 20.5 (Intraocular Lens) ×3 IMPLANT
PACK CATARACT (MISCELLANEOUS) ×3 IMPLANT
PACK CATARACT BRASINGTON LX (MISCELLANEOUS) ×3 IMPLANT
PACK EYE AFTER SURG (MISCELLANEOUS) ×3 IMPLANT
SOL BSS BAG (MISCELLANEOUS) ×3
SOLUTION BSS BAG (MISCELLANEOUS) ×1 IMPLANT
SYR 5ML LL (SYRINGE) ×3 IMPLANT
WATER STERILE IRR 250ML POUR (IV SOLUTION) ×3 IMPLANT
WIPE NON LINTING 3.25X3.25 (MISCELLANEOUS) ×3 IMPLANT

## 2016-09-09 NOTE — H&P (Signed)
All labs reviewed. Abnormal studies sent to patients PCP when indicated.  Previous H&P reviewed, patient examined, there are NO CHANGES.  Noah Boone LOUIS8/28/201810:30 AM

## 2016-09-09 NOTE — Transfer of Care (Signed)
Immediate Anesthesia Transfer of Care Note  Patient: Noah Boone  Procedure(s) Performed: Procedure(s) with comments: CATARACT EXTRACTION PHACO AND INTRAOCULAR LENS PLACEMENT (IOC) (Right) - [place] 00:39 AP% 16.3 CDE 6.36 Fluid pack lot [id number]  Patient Location: PACU  Anesthesia Type:MAC  Level of Consciousness: awake, alert  and oriented  Airway & Oxygen Therapy: Patient Spontanous Breathing and Patient connected to nasal cannula oxygen  Post-op Assessment: Report given to RN and Post -op Vital signs reviewed and stable  Post vital signs: Reviewed and stable  Last Vitals:  Vitals:   09/09/16 0916 09/09/16 1059  BP: (!) 155/57 (!) 144/66  Pulse: 66   Resp: 16 12  Temp: 36.6 C (!) 36.4 C  SpO2: 100%     Last Pain:  Vitals:   09/09/16 1059  TempSrc: Temporal         Complications: No apparent anesthesia complications

## 2016-09-09 NOTE — Anesthesia Preprocedure Evaluation (Signed)
Anesthesia Evaluation  Patient identified by MRN, date of birth, ID band Patient awake    Reviewed: Allergy & Precautions, NPO status , Patient's Chart, lab work & pertinent test results  History of Anesthesia Complications Negative for: history of anesthetic complications  Airway Mallampati: III       Dental   Pulmonary neg sleep apnea, neg COPD, former smoker,           Cardiovascular hypertension, Pt. on medications      Neuro/Psych CVA (left thumb numbness, mild left leg weakness), Residual Symptoms    GI/Hepatic Neg liver ROS, GERD  Medicated and Controlled,  Endo/Other  neg diabetes  Renal/GU negative Renal ROS     Musculoskeletal   Abdominal   Peds  Hematology   Anesthesia Other Findings   Reproductive/Obstetrics                             Anesthesia Physical Anesthesia Plan  ASA: III  Anesthesia Plan: MAC   Post-op Pain Management:    Induction:   PONV Risk Score and Plan:   Airway Management Planned:   Additional Equipment:   Intra-op Plan:   Post-operative Plan:   Informed Consent: I have reviewed the patients History and Physical, chart, labs and discussed the procedure including the risks, benefits and alternatives for the proposed anesthesia with the patient or authorized representative who has indicated his/her understanding and acceptance.     Plan Discussed with:   Anesthesia Plan Comments:         Anesthesia Quick Evaluation

## 2016-09-09 NOTE — Discharge Instructions (Signed)
Eye Surgery Discharge Instructions  Expect mild scratchy sensation or mild soreness. DO NOT RUB YOUR EYE!  The day of surgery:  Minimal physical activity, but bed rest is not required  No reading, computer work, or close hand work  No bending, lifting, or straining.  May watch TV  For 24 hours:  No driving, legal decisions, or alcoholic beverages  Safety precautions  Eat anything you prefer: It is better to start with liquids, then soup then solid foods.  _____ Eye patch should be worn until postoperative exam tomorrow.  __x__ Solar shield eyeglasses should be worn for comfort in the sunlight/patch while sleeping  Resume all regular medications including aspirin or Coumadin if these were discontinued prior to surgery. You may shower, bathe, shave, or wash your hair. Tylenol may be taken for mild discomfort.  Call your doctor if you experience significant pain, nausea, or vomiting, fever > 101 or other signs of infection. [phone] or [phone] Specific instructions:  Follow-up Information    Galen Manila, MD Follow up on 09/10/2016.   Specialty:  Ophthalmology Why:  follow up appointment in the office at 10:05 am Contact information: [address] [facility] [place] [address] [phone]

## 2016-09-09 NOTE — Anesthesia Postprocedure Evaluation (Signed)
Anesthesia Post Note  Patient: Danon Lograsso  Procedure(s) Performed: Procedure(s) (LRB): CATARACT EXTRACTION PHACO AND INTRAOCULAR LENS PLACEMENT (IOC) (Right)  Patient location during evaluation: PACU Anesthesia Type: MAC Level of consciousness: awake, awake and alert and oriented Pain management: pain level controlled Vital Signs Assessment: post-procedure vital signs reviewed and stable Respiratory status: spontaneous breathing Cardiovascular status: blood pressure returned to baseline Postop Assessment: no signs of nausea or vomiting Anesthetic complications: no     Last Vitals:  Vitals:   09/09/16 0916 09/09/16 1059  BP: (!) 155/57 (!) 144/66  Pulse: 66   Resp: 16 12  Temp: 36.6 C (!) 36.4 C  SpO2: 100%     Last Pain:  Vitals:   09/09/16 1059  TempSrc: Temporal                 Gordie Crumby Lorenza Chick

## 2016-09-09 NOTE — Anesthesia Procedure Notes (Signed)
Procedure Name: MAC Date/Time: 09/09/2016 10:35 AM Performed by: Hedda Slade Pre-anesthesia Checklist: Patient identified, Emergency Drugs available, Suction available and Patient being monitored Oxygen Delivery Method: Nasal cannula

## 2016-09-09 NOTE — Progress Notes (Signed)
CH made initial visit with Pt in [facility] 22.  Pt is awake and alert and awaiting cataract surgery. Pt appeared a bit apprehensive and stated he is always like this before a procedure. CH provided a calming presence and prayer.    09/09/16 1000  Clinical Encounter Type  Visited With Patient;Health care provider  Visit Type Initial;Pre-op  Consult/Referral To Chaplain  Spiritual Encounters  Spiritual Needs Prayer

## 2016-09-09 NOTE — Op Note (Signed)
PREOPERATIVE DIAGNOSIS:  Nuclear sclerotic cataract of the right eye.   POSTOPERATIVE DIAGNOSIS:  nuclear sclerotic cataract right eye   OPERATIVE PROCEDURE: Procedure(s): CATARACT EXTRACTION PHACO AND INTRAOCULAR LENS PLACEMENT (IOC)   SURGEON:  Galen Manila, MD.   ANESTHESIA:  Anesthesiologist: Naomie Dean, MD CRNA: Karoline Caldwell, CRNA  1.      Managed anesthesia care. 2.      0.45ml of Shugarcaine was instilled in the eye following the paracentesis.   COMPLICATIONS:  None.   TECHNIQUE:   Stop and chop   DESCRIPTION OF PROCEDURE:  The patient was examined and consented in the preoperative holding area where the aforementioned topical anesthesia was applied to the right eye and then brought back to the Operating Room where the right eye was prepped and draped in the usual sterile ophthalmic fashion and a lid speculum was placed. A paracentesis was created with the side port blade and the anterior chamber was filled with viscoelastic. A near clear corneal incision was performed with the steel keratome. A continuous curvilinear capsulorrhexis was performed with a cystotome followed by the capsulorrhexis forceps. Hydrodissection and hydrodelineation were carried out with BSS on a blunt cannula. The lens was removed in a stop and chop  technique and the remaining cortical material was removed with the irrigation-aspiration handpiece. The capsular bag was inflated with viscoelastic and the Technis [id number]  lens was placed in the capsular bag without complication. The remaining viscoelastic was removed from the eye with the irrigation-aspiration handpiece. The wounds were hydrated. The anterior chamber was flushed with Miostat and the eye was inflated to physiologic pressure. 0.38ml of Vigamox was placed in the anterior chamber. The wounds were found to be water tight. The eye was dressed with Vigamox. The patient was given protective glasses to wear throughout the day and a shield with which  to sleep tonight. The patient was also given drops with which to begin a drop regimen today and will follow-up with me in one day.  Implant Name Type Inv. Item Serial No. Manufacturer Lot No. LRB No. Used  LENS IOL DIOP 20.5 - [id number] Intraocular Lens LENS IOL DIOP 20.5 [id number]   Right 1   Procedure(s) with comments: CATARACT EXTRACTION PHACO AND INTRAOCULAR LENS PLACEMENT (IOC) (Right) - [place] 00:39 AP% 16.3 CDE 6.36 Fluid pack lot 3 [id number]  Electronically signed: Joycelin Radloff LOUIS 09/09/2016 10:58 AM

## 2016-09-09 NOTE — Anesthesia Post-op Follow-up Note (Signed)
Anesthesia QCDR form completed.        

## 2016-09-10 ENCOUNTER — Encounter: Payer: Self-pay | Admitting: Ophthalmology

## 2021-08-19 ENCOUNTER — Other Ambulatory Visit (HOSPITAL_COMMUNITY): Payer: Self-pay | Admitting: Internal Medicine

## 2021-08-19 ENCOUNTER — Other Ambulatory Visit: Payer: Self-pay | Admitting: Internal Medicine

## 2021-08-20 ENCOUNTER — Other Ambulatory Visit: Payer: Self-pay | Admitting: Internal Medicine

## 2021-08-20 DIAGNOSIS — R198 Other specified symptoms and signs involving the digestive system and abdomen: Secondary | ICD-10-CM

## 2021-08-20 DIAGNOSIS — N1832 Chronic kidney disease, stage 3b: Secondary | ICD-10-CM

## 2021-08-23 ENCOUNTER — Other Ambulatory Visit: Payer: Self-pay | Admitting: Internal Medicine

## 2021-08-23 DIAGNOSIS — R198 Other specified symptoms and signs involving the digestive system and abdomen: Secondary | ICD-10-CM

## 2021-08-28 ENCOUNTER — Ambulatory Visit
Admission: RE | Admit: 2021-08-28 | Discharge: 2021-08-28 | Disposition: A | Discharge status: Home / Self Care | Payer: Medicare PPO | Source: Ambulatory Visit | Attending: Internal Medicine | Admitting: Internal Medicine

## 2021-08-28 DIAGNOSIS — R198 Other specified symptoms and signs involving the digestive system and abdomen: Secondary | ICD-10-CM | POA: Insufficient documentation

## 2021-11-01 ENCOUNTER — Inpatient Hospital Stay: Admission: RE | Admit: 2021-11-01 | Payer: Medicare Other | Source: Ambulatory Visit

## 2021-12-04 ENCOUNTER — Emergency Department: Payer: Medicare PPO

## 2021-12-04 ENCOUNTER — Observation Stay: Payer: Medicare PPO

## 2021-12-04 ENCOUNTER — Other Ambulatory Visit: Payer: Self-pay

## 2021-12-04 ENCOUNTER — Observation Stay
Admission: EM | Admit: 2021-12-04 | Discharge: 2021-12-04 | Disposition: A | Discharge status: Home / Self Care | Payer: Medicare PPO | Attending: Internal Medicine | Admitting: Internal Medicine

## 2021-12-04 DIAGNOSIS — Z8673 Personal history of transient ischemic attack (TIA), and cerebral infarction without residual deficits: Secondary | ICD-10-CM | POA: Insufficient documentation

## 2021-12-04 DIAGNOSIS — I6381 Other cerebral infarction due to occlusion or stenosis of small artery: Secondary | ICD-10-CM | POA: Diagnosis not present

## 2021-12-04 DIAGNOSIS — R531 Weakness: Secondary | ICD-10-CM | POA: Diagnosis present

## 2021-12-04 DIAGNOSIS — R471 Dysarthria and anarthria: Secondary | ICD-10-CM | POA: Insufficient documentation

## 2021-12-04 DIAGNOSIS — I447 Left bundle-branch block, unspecified: Secondary | ICD-10-CM | POA: Diagnosis not present

## 2021-12-04 DIAGNOSIS — I639 Cerebral infarction, unspecified: Secondary | ICD-10-CM

## 2021-12-04 DIAGNOSIS — Z79899 Other long term (current) drug therapy: Secondary | ICD-10-CM | POA: Diagnosis not present

## 2021-12-04 DIAGNOSIS — Z7902 Long term (current) use of antithrombotics/antiplatelets: Secondary | ICD-10-CM | POA: Diagnosis not present

## 2021-12-04 DIAGNOSIS — R29898 Other symptoms and signs involving the musculoskeletal system: Secondary | ICD-10-CM

## 2021-12-04 LAB — DIFFERENTIAL
Abs Immature Granulocytes: 0.01 10*3/uL (ref 0.00–0.07)
Basophils Absolute: 0.1 10*3/uL (ref 0.0–0.1)
Basophils Relative: 1 %
Eosinophils Absolute: 0.1 10*3/uL (ref 0.0–0.5)
Eosinophils Relative: 2 %
Immature Granulocytes: 0 %
Lymphocytes Relative: 36 %
Lymphs Abs: 2.1 10*3/uL (ref 0.7–4.0)
Monocytes Absolute: 0.5 10*3/uL (ref 0.1–1.0)
Monocytes Relative: 9 %
Neutro Abs: 3.1 10*3/uL (ref 1.7–7.7)
Neutrophils Relative %: 52 %

## 2021-12-04 LAB — CBC
HCT: 39.1 % (ref 39.0–52.0)
Hemoglobin: 12.8 g/dL — ABNORMAL LOW (ref 13.0–17.0)
MCH: 30.9 pg (ref 26.0–34.0)
MCHC: 32.7 g/dL (ref 30.0–36.0)
MCV: 94.4 fL (ref 80.0–100.0)
Platelets: 247 10*3/uL (ref 150–400)
RBC: 4.14 MIL/uL — ABNORMAL LOW (ref 4.22–5.81)
RDW: 13.8 % (ref 11.5–15.5)
WBC: 5.9 10*3/uL (ref 4.0–10.5)
nRBC: 0 % (ref 0.0–0.2)

## 2021-12-04 LAB — COMPREHENSIVE METABOLIC PANEL
ALT: 19 U/L (ref 0–44)
AST: 32 U/L (ref 15–41)
Albumin: 4.2 g/dL (ref 3.5–5.0)
Alkaline Phosphatase: 76 U/L (ref 38–126)
Anion gap: 6 (ref 5–15)
BUN: 33 mg/dL — ABNORMAL HIGH (ref 8–23)
CO2: 27 mmol/L (ref 22–32)
Calcium: 9.8 mg/dL (ref 8.9–10.3)
Chloride: 108 mmol/L (ref 98–111)
Creatinine, Ser: 1.55 mg/dL — ABNORMAL HIGH (ref 0.61–1.24)
GFR, Estimated: 42 mL/min — ABNORMAL LOW (ref >60)
Glucose, Bld: 101 mg/dL — ABNORMAL HIGH (ref 70–99)
Potassium: 4.4 mmol/L (ref 3.5–5.1)
Sodium: 141 mmol/L (ref 135–145)
Total Bilirubin: 0.6 mg/dL (ref 0.3–1.2)
Total Protein: 7.7 g/dL (ref 6.5–8.1)

## 2021-12-04 LAB — PROTIME-INR
INR: 1 (ref 0.8–1.2)
Prothrombin Time: 13.2 seconds (ref 11.4–15.2)

## 2021-12-04 LAB — ETHANOL: Alcohol, Ethyl (B): <10 mg/dL (ref <10)

## 2021-12-04 LAB — APTT: aPTT: 31 seconds (ref 24–36)

## 2021-12-04 MED ORDER — ASPIRIN 81 MG PO CHEW
324.0000 mg | CHEWABLE_TABLET | Freq: Once | ORAL | Status: AC
  Administered 2021-12-04: 324 mg via ORAL
  Filled 2021-12-04: qty 4

## 2021-12-04 NOTE — ED Notes (Signed)
Pt. Up to bathroom with stand by assist and cane. NAD.

## 2021-12-04 NOTE — ED Notes (Signed)
MD at bedside discussing plan of care with Pt.

## 2021-12-04 NOTE — ED Notes (Signed)
Neuro exited room and stated that the pt once again refused admission and is going home.

## 2021-12-04 NOTE — ED Triage Notes (Signed)
Pt to ED POV with friend. Pt woke up yesterday morning (over 24 hours ago) with dysarthria, drooling and unsteady gait. Pt was normal night before when went to bed. Pt woke up around 0600 and took shower, then fell as getting out of shower (he states his legs gave out and he slowly lowered self to floor, then crawled to couch because could not walk (both legs weak, R leg weaker).   Pt ambulates with cane and walked into lobby using cane, placed in wheelchair. Pt is alert, oriented. Pt noted to have slightly slurred speech. Pt takes Plavix 75mg  per day.

## 2021-12-04 NOTE — ED Notes (Signed)
Pharm at bedside

## 2021-12-04 NOTE — Consult Note (Incomplete)
NEUROLOGY CONSULTATION NOTE   Date of service: December 04, 2021 Patient Name: Noah Boone MRN:  [id number] DOB:  21-Feb-1929 Reason for consult: acute ischemic stroke Requesting physician: Sharyn Creamer MD _ _ _   _ __   _ __ _ _  __ __   _ __   __ _  History of Present Illness   88 yo man with hx prior stroke, HTN, HL p/w RLE weakness and dysarthria x24 hrs. When he woke up yesterday AM his RLE felt weak. This worsened over the course of the day and later when he tried to take a shower he was unable to stand on his RLE 2/2 weakness and had to lower himself into the bathtub and crawl out. No head trauma or injury while doing this. He had a prior stroke 10 yrs ago and takes plavix 75mg  daily at home. CT head NAICP, personal review. MRI brain confirmed acute infarct L posterior limb of internal capsule, personal review.  ***   ROS   Per HPI: all other systems reviewed and are negative  Past History   I have reviewed the following:  Past Medical History:  Diagnosis Date   Arthritis    GERD (gastroesophageal reflux disease)    Hypercholesteremia    Hypertension    Stroke [facility])    Past Surgical History:  Procedure Laterality Date   CATARACT EXTRACTION W/PHACO Left 08/19/2016   Procedure: CATARACT EXTRACTION PHACO AND INTRAOCULAR LENS PLACEMENT (IOC);  Surgeon: 10/19/2016, MD;  Location: [facility] ORS;  Service: Ophthalmology;  Laterality: Left;  Galen Manila 00:59 AP% 15.5 CDE 9.23 fluid pack lot # [id number]   CATARACT EXTRACTION W/PHACO Right 09/09/2016   Procedure: CATARACT EXTRACTION PHACO AND INTRAOCULAR LENS PLACEMENT (IOC);  Surgeon: 09/11/2016, MD;  Location: [facility] ORS;  Service: Ophthalmology;  Laterality: Right;  Galen Manila 00:39 AP% 16.3 CDE 6.36 Fluid pack lot 3 [place] [id number]   COLONOSCOPY     EYE SURGERY     TONSILLECTOMY     History reviewed. No pertinent family history. Social History   Socioeconomic History   Marital status: Married    Spouse name: Not on file   Number  of children: Not on file   Years of education: Not on file   Highest education level: Not on file  Occupational History   Not on file  Tobacco Use   Smoking status: Former    Types: Cigarettes    Quit date: 01/14/1971    Years since quitting: 50.9   Smokeless tobacco: Former  03/14/1971 Use: Never used  Substance and Sexual Activity   Alcohol use: Yes    Comment: daily- glass of wine   Drug use: No   Sexual activity: Not on file  Other Topics Concern   Not on file  Social History Narrative   Not on file   Social Determinants of Health   Financial Resource Strain: Not on file  Food Insecurity: No Food Insecurity (12/04/2021)   Hunger Vital Sign    Worried About Running Out of Food in the Last Year: Never true    Ran Out of Food in the Last Year: Never true  Transportation Needs: No Transportation Needs (12/04/2021)   PRAPARE - 12/06/2021 (Medical): No    Lack of Transportation (Non-Medical): No  Physical Activity: Not on file  Stress: Not on file  Social Connections: Not on file   No Known Allergies  Medications   (Not in a  hospital admission)    No current facility-administered medications for this encounter.  Current Outpatient Medications:    amLODipine (NORVASC) 5 MG tablet, Take 5 mg by mouth at bedtime. , Disp: , Rfl:    clopidogrel (PLAVIX) 75 MG tablet, Take 75 mg by mouth daily., Disp: , Rfl:    levothyroxine (SYNTHROID) 50 MCG tablet, Take 50 mcg by mouth daily., Disp: , Rfl:    lovastatin (MEVACOR) 40 MG tablet, Take 40 mg by mouth at bedtime., Disp: , Rfl:    pantoprazole (PROTONIX) 40 MG tablet, Take 40 mg by mouth daily., Disp: , Rfl:    Difluprednate (DUREZOL) 0.05 % EMUL, Place 1 drop into the right eye 2 (two) times daily. (Patient not taking: Reported on 12/04/2021), Disp: , Rfl:    ILEVRO 0.3 % ophthalmic suspension, Place 1 drop into the right eye daily. (Patient not taking: Reported on 12/04/2021), Disp: ,  Rfl: 0  Vitals   Vitals:   12/04/21 1045 12/04/21 1100 12/04/21 1115 12/04/21 1200  BP:  135/66  (!) 152/79  Pulse: 73 72 86 77  Resp: (!) 24 (!) 25 (!) 24 16  Temp:      TempSrc:      SpO2: 97% 98% 98% 98%  Weight:      Height:         Body mass index is 21.52 kg/m.  Physical Exam   *** Physical Exam Gen: A&O x4, NAD HEENT: Atraumatic, normocephalic;mucous membranes moist; oropharynx clear, tongue without atrophy or fasciculations. Neck: Supple, trachea midline. Resp: CTAB, no w/r/r CV: RRR, no m/g/r; nml S1 and S2. 2+ symmetric peripheral pulses. Abd: soft/NT/ND; nabs x 4 quad Extrem: Nml bulk; no cyanosis, clubbing, or edema.  Neuro: *MS: A&O x4. Follows multi-step commands.  *Speech: fluid, nondysarthric, able to name and repeat *CN:    I: Deferred   II,III: PERRLA, VFF by confrontation, optic discs unable to be visualized 2/2 pupillary constriction   III,IV,VI: EOMI w/o nystagmus, no ptosis   V: Sensation intact from V1 to V3 to LT   VII: Eyelid closure was full.  Smile symmetric.   VIII: Hearing intact to voice   IX,X: Voice normal, palate elevates symmetrically    XI: SCM/trap 5/5 bilat   XII: Tongue protrudes midline, no atrophy or fasciculations   *Motor:   Normal bulk.  No tremor, rigidity or bradykinesia. No pronator drift.    Strength: Dlt Bic Tri WrE WrF FgS Gr HF KnF KnE PlF DoF    Left 5 5 5 5 5 5 5 5 5 5 5 5     Right 5 5 5 5 5 5 5 5 5 5 5 5     *Sensory: Intact to light touch, pinprick, temperature vibration throughout. Symmetric. Propioception intact bilat.  No double-simultaneous extinction.  *Coordination:  Finger-to-nose, heel-to-shin, rapid alternating motions were intact. *Reflexes:  2+ and symmetric throughout without clonus; toes down-going bilat *Gait: normal base, normal stride, normal turn. Negative Romberg.  NIHSS  1a Level of Conscious.: *** 1b LOC Questions: *** 1c LOC Commands: *** 2 Best Gaze: *** 3 Visual: *** 4 Facial  Palsy: *** 5a Motor Arm - left: *** 5b Motor Arm - Right: *** 6a Motor Leg - Left: *** 6b Motor Leg - Right: *** 7 Limb Ataxia: *** 8 Sensory: *** 9 Best Language: *** 10 Dysarthria: *** 11 Extinct. and Inatten.: ***  TOTAL: ***   Premorbid mRS = ***   Labs   CBC:  Recent Labs  Lab 12/04/21  0905  WBC 5.9  NEUTROABS 3.1  HGB 12.8*  HCT 39.1  MCV 94.4  PLT 247    Basic Metabolic Panel:  Lab Results  Component Value Date   NA 141 12/04/2021   K 4.4 12/04/2021   CO2 27 12/04/2021   GLUCOSE 101 (H) 12/04/2021   BUN 33 (H) 12/04/2021   CREATININE 1.55 (H) 12/04/2021   CALCIUM 9.8 12/04/2021   GFRNONAA 42 (L) 12/04/2021   GFRAA >60 08/15/2011   Lipid Panel:  Lab Results  Component Value Date   LDLCALC 91 08/15/2011   HgbA1c: No results found for: "HGBA1C" Urine Drug Screen: No results found for: "LABOPIA", "COCAINSCRNUR", "LABBENZ", "AMPHETMU", "THCU", "LABBARB"  Alcohol Level     Component Value Date/Time   ETH <10 12/04/2021 0905     Impression   88 yo man with hx prior stroke, HTN, HL p/w RLE weakness and dysarthria x24 hrs. MRI brain confirmed acute infarct L posterior limb of internal capsule. Admit for stroke workup below  Recommendations   - Admit for stroke workup to [facility] hospitalist service - Permissive HTN x48 hrs from sx onset or until stroke ruled out by MRI goal BP <220/110. PRN labetalol or hydralazine if BP above these parameters. Avoid oral antihypertensives. - CTA or MRA H&N - TTE  - Check A1c and LDL + add statin per guidelines - ASA 81mg  daily + plavix 75mg  daily x21 days f/b plavix 75mg  daily monotherapy after that - q4 hr neuro checks - STAT head CT for any change in neuro exam - Tele - PT/OT/SLP - Stroke education - Amb referral to neurology upon discharge  Will f/u on results of stroke workup  ______________________________________________________________________   Thank you for the opportunity to take part in the care  of this patient. If you have any further questions, please contact the neurology consultation attending.  Signed,  , MD [facility] [phone]  If 7pm- 7am, please page neurology on call as listed in AMION.  **Any copied and pasted documentation in this note was written by me in another application not billed for and pasted by me into this document.

## 2021-12-04 NOTE — ED Notes (Signed)
Informed RN bed assigned 

## 2021-12-04 NOTE — Discharge Instructions (Addendum)
Please continue to take your current medications and add a 81 mg "baby aspirin" daily.  Follow-up closely with neurology  Return right away if you experience new or worsening symptoms, headache, weakness, fever, or other concerns arise.  Recommend that you not drive as you have just had a stroke.  Do not drive until cleared by cyst neurology

## 2021-12-04 NOTE — ED Notes (Addendum)
Neuro at bedside. Pt has refused admission for ED MD and Dr. Joylene Igo, hospitalist stating "that if I go home and have a stroke and die I'm okay with it."

## 2021-12-04 NOTE — ED Notes (Signed)
MRI called to prescreen pt for procedure. Gave pt phone. Adjusted leads for cardiac monitor and spoke to pt about treatment plan.

## 2021-12-04 NOTE — ED Provider Notes (Signed)
[facility] Provider Note   Event Date/Time   First MD Initiated Contact with Patient 12/04/21 [phone]     (approximate)  History   strokelike symptoms (LKW yesterday)  HPI  Noah Boone is a 88 y.o. male who for a little over 24 hours now has been experiencing a feeling of slight weakness in his right leg and a little difficulty walking because of it as well as difficulty speaking.  He woke up yesterday morning and noticed when he was getting up that his right leg just felt a little weak or tired, and when he was speaking to his friend he could not make words as clear as typical.  No headache no chest pain no shortness of breath no neck pain.  He got in the shower yesterday and felt sort of weak had a hard time standing because his right leg felt weak and he lowered himself into the bathtub and crawled out.  There is no fall or injury.  He had a previous "stroke" about 10 years ago and he takes Plavix daily, he last took it yesterday   Physical Exam   Triage Vital Signs: ED Triage Vitals [12/04/21 0900]  Enc Vitals Group     BP      Pulse      Resp      Temp      Temp src      SpO2      Weight 150 lb (68 kg)     Height 5\' 10"  (1.778 m)     Head Circumference      Peak Flow      Pain Score 0     Pain Loc      Pain Edu?      Excl. in GC?     Most recent vital signs: Vitals:   12/04/21 1200 12/04/21 1305  BP: (!) 152/79 (!) 159/68  Pulse: 77 74  Resp: 16 19  Temp:  97.9 F (36.6 C)  SpO2: 98% 100%    General: Awake, no distress.  Very pleasant CV:  Good peripheral perfusion.  Normal tones Resp:  Normal effort.  Clear Abd:  No distention.  Other:  NIH score equals 2 (speech dysarthria, mild and mild R leg weakness to drift), VAN stroke scale negative  ED Results / Procedures / Treatments   Labs (all labs ordered are listed, but only abnormal results are displayed) Labs Reviewed  CBC - Abnormal; Notable for the following  components:      Result Value   RBC 4.14 (*)    Hemoglobin 12.8 (*)    All other components within normal limits  COMPREHENSIVE METABOLIC PANEL - Abnormal; Notable for the following components:   Glucose, Bld 101 (*)    BUN 33 (*)    Creatinine, Ser 1.55 (*)    GFR, Estimated 42 (*)    All other components within normal limits  PROTIME-INR  APTT  DIFFERENTIAL  ETHANOL  I-STAT CREATININE, ED  CBG MONITORING, ED     EKG  And interpreted by me at 9 AM heart rate 90 QRS 130 QTc 480 Normal sinus rhythm.  Left bundle branch block.   RADIOLOGY   CT head personally interpreted by me for acute gross pathology, no acute intracranial hemorrhage to noted  MR BRAIN WO CONTRAST  Result Date: 12/04/2021 CLINICAL DATA:  Neuro deficit, acute, stroke suspected. Dysarthria, drooling, and unsteady gait beginning greater than 24 hours ago. Right greater than left leg  weakness. EXAM: MRI HEAD WITHOUT CONTRAST TECHNIQUE: Multiplanar, multiecho pulse sequences of the brain and surrounding structures were obtained without intravenous contrast. COMPARISON:  Head CT 12/04/2021 and MRI 08/14/2011 FINDINGS: Brain: There is a small acute infarct involving the left lentiform nucleus and posterior limb of the left internal capsule. Prominent mineralization is noted in the basal ganglia bilaterally. There is no evidence of an acute intracranial hemorrhage, mass, midline shift, or extra-axial fluid collection. T2 hyperintensities in the cerebral white matter bilaterally are similar to the prior MRI and are nonspecific but compatible with mild chronic small vessel ischemic disease. A chronic lacunar infarct is noted in the right thalamus. There is moderate cerebral atrophy. Vascular: Major intracranial arterial flow voids are preserved. Suspected chronic slow flow in the left sigmoid sinus. Skull and upper cervical spine: Unremarkable bone marrow signal. Sinuses/Orbits: Bilateral cataract extraction. Minimal mucosal  thickening in the paranasal sinuses. Clear mastoid air cells. Other: None. IMPRESSION: 1. Small acute left basal ganglia infarct. 2. Mild chronic small vessel ischemic disease. Electronically Signed   By: Sebastian Ache M.D.   On: 12/04/2021 12:51   CT HEAD WO CONTRAST  Result Date: 12/04/2021 CLINICAL DATA:  Acute neurologic deficit. Right leg weakness and dysarthria. EXAM: CT HEAD WITHOUT CONTRAST TECHNIQUE: Contiguous axial images were obtained from the base of the skull through the vertex without intravenous contrast. RADIATION DOSE REDUCTION: This exam was performed according to the departmental dose-optimization program which includes automated exposure control, adjustment of the mA and/or kV according to patient size and/or use of iterative reconstruction technique. COMPARISON:  06/24/2011 and MRI brain 08/14/2011 FINDINGS: Brain: The brainstem, cerebellum, cerebral peduncles, thalami, basal ganglia, basilar cisterns, and ventricular system appear within normal limits. Periventricular white matter and corona radiata hypodensities favor chronic ischemic microvascular white matter disease. No intracranial hemorrhage, mass lesion, or acute CVA. Vascular: There is atherosclerotic calcification of the cavernous carotid arteries bilaterally. Skull: Unremarkable Sinuses/Orbits: Minimal chronic right sphenoid sinusitis. Other: No supplemental non-categorized findings. IMPRESSION: 1. No acute intracranial findings. 2. Periventricular white matter and corona radiata hypodensities favor chronic ischemic microvascular white matter disease. 3. Atherosclerosis. 4. Minimal chronic right sphenoid sinusitis. Electronically Signed   By: Gaylyn Rong M.D.   On: 12/04/2021 10:40      PROCEDURES:  Critical Care performed: No  Procedures   MEDICATIONS ORDERED IN ED: Medications - No data to display   IMPRESSION / MDM / ASSESSMENT AND PLAN / ED COURSE  I reviewed the triage vital signs and the nursing  notes.                              Differential diagnosis includes, but is not limited to, stroke, subclinical seizure, mass lesion, or other central cause.  His dysarthria with associated right leg weakness seems most likely to represent a stroke.  Currently awaiting imaging CT head, neurology consult, and broad workup.  He denies any systemic symptoms no cardiac or pulmonary symptoms.  No infectious symptoms  ----------------------------------------- 10:31 AM on 12/04/2021 ----------------------------------------- Dr. Selina Cooley acknowledges neurology consult which I have placed with her.  I placed this with her on a routine basis as the patient is well outside of any treatment windows in the event this was a potential stroke.  No TNK (symptoms greater than 24 hours).   Labs interpreted as mild anemia.  Elevated creatinine 1.5, unclear the exact chronicity of this though previous labs back as far as 10 years ago  do show some mildly elevated creatinines.  He appears euvolemic  Patient's presentation is most consistent with acute presentation with potential threat to life or bodily function.  The patient is on the cardiac monitor to evaluate for evidence of arrhythmia and/or significant heart rate changes.    MRI is positive for acute infarct  ----------------------------------------- 1:13 PM on 12/04/2021 ----------------------------------------- Patient resting comfortably without distress.  I discussed with him and also his friend that he has had a stroke.  He is aware, and patient voices to me that although he understands that we are asking him to stay in the hospital and to minimize risk of further stroke, he voices that at his age and with his goals of care since he is still able to walk and get around he wishes to be discharged.  He is amenable however to being consulted on with our neurologist prior to discharge.  Clearly this is atypical, and I have strongly recommended to be admitted  to the hospital but he advises he will not be admitted and wishes to go home.  He is accepting of the risk of potentially having another major stroke or disability.  He voiced that if he goes home and has a major stroke and he dies from it that okay by his age and goals of care.  Based on shared medical decision making, discussion with patient, discussion with his friend at the bedside, and the patient's clear capacity to make his own decisions I anticipate the patient will be discharged to home after an acute stroke.  We will however involved Dr. Selina Cooley to get further recommendations and follow-up recommendations.  I discussed with Dr. Selina Cooley that the patient does not not wish to be admitted and we should make recommendations in keeping with his goal to be discharged with follow-up.  ----------------------------------------- 2:07 PM on 12/04/2021 ----------------------------------------- Dr. Selina Cooley recommends the patient continue his current medications including his Plavix, and add a baby aspirin daily for the next 3 weeks.  We have placed an urgent referral for neurology follow-up and also asked the patient to follow-up with his primary care.  Return precautions and treatment recommendations and follow-up discussed with the patient who is agreeable with the plan.   FINAL CLINICAL IMPRESSION(S) / ED DIAGNOSES   Final diagnoses:  Dysarthria  Right leg weakness     Rx / DC Orders   ED Discharge Orders     None        Note:  This document was prepared using Dragon voice recognition software and may include unintentional dictation errors.   Sharyn Creamer, MD 12/04/21 1407

## 2022-03-31 ENCOUNTER — Emergency Department: Payer: Medicare PPO

## 2022-03-31 ENCOUNTER — Other Ambulatory Visit: Payer: Self-pay

## 2022-03-31 ENCOUNTER — Observation Stay: Payer: Medicare PPO

## 2022-03-31 ENCOUNTER — Encounter: Payer: Self-pay | Admitting: Emergency Medicine

## 2022-03-31 ENCOUNTER — Observation Stay
Admission: EM | Admit: 2022-03-31 | Discharge: 2022-04-01 | Disposition: A | Discharge status: Home / Self Care | Payer: Medicare PPO | Attending: Internal Medicine | Admitting: Internal Medicine

## 2022-03-31 ENCOUNTER — Observation Stay (HOSPITAL_BASED_OUTPATIENT_CLINIC_OR_DEPARTMENT_OTHER)
Admit: 2022-03-31 | Discharge: 2022-03-31 | Disposition: A | Discharge status: Home / Self Care | Payer: Medicare PPO | Attending: Family Medicine | Admitting: Family Medicine

## 2022-03-31 DIAGNOSIS — I10 Essential (primary) hypertension: Secondary | ICD-10-CM | POA: Insufficient documentation

## 2022-03-31 DIAGNOSIS — Z79899 Other long term (current) drug therapy: Secondary | ICD-10-CM | POA: Diagnosis not present

## 2022-03-31 DIAGNOSIS — I6389 Other cerebral infarction: Secondary | ICD-10-CM

## 2022-03-31 DIAGNOSIS — R2689 Other abnormalities of gait and mobility: Secondary | ICD-10-CM | POA: Diagnosis not present

## 2022-03-31 DIAGNOSIS — S51011A Laceration without foreign body of right elbow, initial encounter: Secondary | ICD-10-CM | POA: Insufficient documentation

## 2022-03-31 DIAGNOSIS — Z23 Encounter for immunization: Secondary | ICD-10-CM | POA: Insufficient documentation

## 2022-03-31 DIAGNOSIS — W19XXXA Unspecified fall, initial encounter: Secondary | ICD-10-CM | POA: Diagnosis not present

## 2022-03-31 DIAGNOSIS — R471 Dysarthria and anarthria: Secondary | ICD-10-CM | POA: Diagnosis not present

## 2022-03-31 DIAGNOSIS — M6281 Muscle weakness (generalized): Secondary | ICD-10-CM | POA: Insufficient documentation

## 2022-03-31 DIAGNOSIS — K219 Gastro-esophageal reflux disease without esophagitis: Secondary | ICD-10-CM | POA: Insufficient documentation

## 2022-03-31 DIAGNOSIS — Y92009 Unspecified place in unspecified non-institutional (private) residence as the place of occurrence of the external cause: Secondary | ICD-10-CM | POA: Diagnosis not present

## 2022-03-31 DIAGNOSIS — Z7982 Long term (current) use of aspirin: Secondary | ICD-10-CM | POA: Diagnosis not present

## 2022-03-31 DIAGNOSIS — E039 Hypothyroidism, unspecified: Secondary | ICD-10-CM | POA: Insufficient documentation

## 2022-03-31 DIAGNOSIS — R4182 Altered mental status, unspecified: Secondary | ICD-10-CM | POA: Diagnosis present

## 2022-03-31 DIAGNOSIS — Z87891 Personal history of nicotine dependence: Secondary | ICD-10-CM | POA: Insufficient documentation

## 2022-03-31 DIAGNOSIS — N179 Acute kidney failure, unspecified: Secondary | ICD-10-CM | POA: Insufficient documentation

## 2022-03-31 DIAGNOSIS — Z7902 Long term (current) use of antithrombotics/antiplatelets: Secondary | ICD-10-CM | POA: Diagnosis not present

## 2022-03-31 DIAGNOSIS — I639 Cerebral infarction, unspecified: Secondary | ICD-10-CM | POA: Diagnosis not present

## 2022-03-31 LAB — HEMOGLOBIN A1C
Hgb A1c MFr Bld: 5.9 % — ABNORMAL HIGH (ref 4.8–5.6)
Mean Plasma Glucose: 123 mg/dL

## 2022-03-31 LAB — CBC
HCT: 38.9 % — ABNORMAL LOW (ref 39.0–52.0)
Hemoglobin: 12.5 g/dL — ABNORMAL LOW (ref 13.0–17.0)
MCH: 30.7 pg (ref 26.0–34.0)
MCHC: 32.1 g/dL (ref 30.0–36.0)
MCV: 95.6 fL (ref 80.0–100.0)
Platelets: 226 10*3/uL (ref 150–400)
RBC: 4.07 MIL/uL — ABNORMAL LOW (ref 4.22–5.81)
RDW: 13.2 % (ref 11.5–15.5)
WBC: 10.9 10*3/uL — ABNORMAL HIGH (ref 4.0–10.5)
nRBC: 0 % (ref 0.0–0.2)

## 2022-03-31 LAB — URINALYSIS, ROUTINE W REFLEX MICROSCOPIC
Bilirubin Urine: NEGATIVE
Glucose, UA: NEGATIVE mg/dL
Hgb urine dipstick: NEGATIVE
Ketones, ur: NEGATIVE mg/dL
Leukocytes,Ua: NEGATIVE
Nitrite: NEGATIVE
Protein, ur: NEGATIVE mg/dL
Specific Gravity, Urine: 1.011 (ref 1.005–1.030)
pH: 5 (ref 5.0–8.0)

## 2022-03-31 LAB — DIFFERENTIAL
Abs Immature Granulocytes: 0.04 10*3/uL (ref 0.00–0.07)
Basophils Absolute: 0.1 10*3/uL (ref 0.0–0.1)
Basophils Relative: 1 %
Eosinophils Absolute: 0.2 10*3/uL (ref 0.0–0.5)
Eosinophils Relative: 2 %
Immature Granulocytes: 0 %
Lymphocytes Relative: 26 %
Lymphs Abs: 2.8 10*3/uL (ref 0.7–4.0)
Monocytes Absolute: 1.2 10*3/uL — ABNORMAL HIGH (ref 0.1–1.0)
Monocytes Relative: 11 %
Neutro Abs: 6.6 10*3/uL (ref 1.7–7.7)
Neutrophils Relative %: 60 %

## 2022-03-31 LAB — ECHOCARDIOGRAM COMPLETE
AR max vel: 2.12 cm2
AV Area VTI: 2.25 cm2
AV Area mean vel: 2.23 cm2
AV Mean grad: 4 mmHg
AV Peak grad: 7.1 mmHg
Ao pk vel: 1.34 m/s
Area-P 1/2: 3.12 cm2
Height: 68 in
MV VTI: 2.37 cm2
S' Lateral: 2.9 cm
Weight: 2398.6 oz

## 2022-03-31 LAB — COMPREHENSIVE METABOLIC PANEL
ALT: 14 U/L (ref 0–44)
AST: 19 U/L (ref 15–41)
Albumin: 3.8 g/dL (ref 3.5–5.0)
Alkaline Phosphatase: 87 U/L (ref 38–126)
Anion gap: 13 (ref 5–15)
BUN: 25 mg/dL — ABNORMAL HIGH (ref 8–23)
CO2: 27 mmol/L (ref 22–32)
Calcium: 9.7 mg/dL (ref 8.9–10.3)
Chloride: 100 mmol/L (ref 98–111)
Creatinine, Ser: 1.39 mg/dL — ABNORMAL HIGH (ref 0.61–1.24)
GFR, Estimated: 48 mL/min — ABNORMAL LOW (ref >60)
Glucose, Bld: 116 mg/dL — ABNORMAL HIGH (ref 70–99)
Potassium: 4.4 mmol/L (ref 3.5–5.1)
Sodium: 140 mmol/L (ref 135–145)
Total Bilirubin: 0.8 mg/dL (ref 0.3–1.2)
Total Protein: 7.4 g/dL (ref 6.5–8.1)

## 2022-03-31 LAB — PROTIME-INR
INR: 1.1 (ref 0.8–1.2)
Prothrombin Time: 13.8 seconds (ref 11.4–15.2)

## 2022-03-31 LAB — CBG MONITORING, ED: Glucose-Capillary: 107 mg/dL — ABNORMAL HIGH (ref 70–99)

## 2022-03-31 LAB — ETHANOL: Alcohol, Ethyl (B): <10 mg/dL (ref <10)

## 2022-03-31 LAB — APTT: aPTT: 33 seconds (ref 24–36)

## 2022-03-31 MED ORDER — CLOPIDOGREL BISULFATE 75 MG PO TABS
75.0000 mg | ORAL_TABLET | Freq: Every day | ORAL | Status: DC
  Administered 2022-03-31 – 2022-04-01 (×2): 75 mg via ORAL
  Filled 2022-03-31 (×2): qty 1

## 2022-03-31 MED ORDER — LEVOTHYROXINE SODIUM 50 MCG PO TABS
50.0000 ug | ORAL_TABLET | Freq: Every day | ORAL | Status: DC
  Administered 2022-04-01: 50 ug via ORAL
  Filled 2022-03-31: qty 1

## 2022-03-31 MED ORDER — GADOBUTROL 1 MMOL/ML IV SOLN
6.0000 mL | Freq: Once | INTRAVENOUS | Status: AC | PRN
  Administered 2022-03-31: 6 mL via INTRAVENOUS

## 2022-03-31 MED ORDER — ASPIRIN 81 MG PO TBEC
81.0000 mg | DELAYED_RELEASE_TABLET | Freq: Every day | ORAL | Status: DC
  Administered 2022-03-31 – 2022-04-01 (×2): 81 mg via ORAL
  Filled 2022-03-31 (×2): qty 1

## 2022-03-31 MED ORDER — LORAZEPAM 0.5 MG PO TABS: 0.5000 mg | ORAL_TABLET | Freq: Once | ORAL | Status: DC | PRN

## 2022-03-31 MED ORDER — STROKE: EARLY STAGES OF RECOVERY BOOK: Freq: Once | Status: DC

## 2022-03-31 MED ORDER — SODIUM CHLORIDE 0.9 % IV SOLN: INTRAVENOUS | Status: AC

## 2022-03-31 MED ORDER — ONDANSETRON HCL 4 MG/2ML IJ SOLN: 4.0000 mg | Freq: Three times a day (TID) | INTRAMUSCULAR | Status: AC | PRN

## 2022-03-31 MED ORDER — PANTOPRAZOLE SODIUM 40 MG PO TBEC
40.0000 mg | DELAYED_RELEASE_TABLET | Freq: Every day | ORAL | Status: DC
  Administered 2022-03-31 – 2022-04-01 (×2): 40 mg via ORAL
  Filled 2022-03-31 (×2): qty 1

## 2022-03-31 MED ORDER — BACITRACIN ZINC 500 UNIT/GM EX OINT
TOPICAL_OINTMENT | Freq: Once | CUTANEOUS | Status: AC
  Filled 2022-03-31: qty 2.7

## 2022-03-31 MED ORDER — FOOD THICKENER (SIMPLYTHICK): 1.0000 | ORAL | Status: DC | PRN

## 2022-03-31 MED ORDER — TETANUS-DIPHTH-ACELL PERTUSSIS 5-2.5-18.5 LF-MCG/0.5 IM SUSY
0.5000 mL | PREFILLED_SYRINGE | Freq: Once | INTRAMUSCULAR | Status: AC
  Administered 2022-03-31: 0.5 mL via INTRAMUSCULAR
  Filled 2022-03-31: qty 0.5

## 2022-03-31 MED ORDER — ATORVASTATIN CALCIUM 80 MG PO TABS
80.0000 mg | ORAL_TABLET | Freq: Every day | ORAL | Status: DC
  Administered 2022-03-31 – 2022-04-01 (×2): 80 mg via ORAL
  Filled 2022-03-31 (×2): qty 1

## 2022-03-31 MED ORDER — AMLODIPINE BESYLATE 5 MG PO TABS: 5.0000 mg | ORAL_TABLET | Freq: Every day | ORAL | Status: DC

## 2022-03-31 NOTE — Evaluation (Signed)
Physical Therapy Evaluation Patient Details Name: Noah Boone MRN: [id number] DOB: 1929-12-28 Today's Date: 03/31/2022  History of Present Illness  Pt is a 89 y/o M admitted on 03/31/22 after presenting with c/o worsening weakness over the past 4-5 days, as well as slurred speech. 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 PMH: GERD, HTN, HLD, stroke  Clinical Impression  Pt seen for PT evaluation with pt agreeable. Pt reports he was living alone, independent without AD prior to this admission. On this date, pt is able to complete bed mobility with supervision with extra time & use of bed rails. Pt demonstrates Good sitting & standing balance. Pt ambulates without AD with close supervision<>CGA with decreased step length LLE, decreased dorsiflexion & heel strike LLE. Provided pt with RW & pt able to ambulate with supervision with education re: proper use of AD. Pt reports his neighbor & girlfriend can assist him at home & he would rather go home with HHPT vs SNF. Recommend HHPT f/u, but no DME needs as pt reports he already has a RW at home.   Recommendations for follow up therapy are one component of a multi-disciplinary discharge planning process, led by the attending physician.  Recommendations may be updated based on patient status, additional functional criteria and insurance authorization.  Follow Up Recommendations Home health PT Can patient physically be transported by private vehicle: Yes    Assistance Recommended at Discharge Intermittent Supervision/Assistance  Patient can return home with the following  A little help with walking and/or transfers;Assistance with cooking/housework;A little help with bathing/dressing/bathroom;Assist for transportation;Help with stairs or ramp for entrance    Equipment Recommendations None recommended by PT (Pt reports he has a RW at home)  Recommendations for Other Services       Functional Status Assessment Patient has had a recent  decline in their functional status and demonstrates the ability to make significant improvements in function in a reasonable and predictable amount of time.     Precautions / Restrictions Precautions Precautions: Fall Restrictions Weight Bearing Restrictions: No      Mobility  Bed Mobility Overal bed mobility: Needs Assistance Bed Mobility: Supine to Sit     Supine to sit: Supervision, HOB elevated     General bed mobility comments: extra time, use of bed rails to complete supine>sit, HOB slightly elevated    Transfers Overall transfer level: Needs assistance Equipment used: None Transfers: Sit to/from Stand Sit to Stand: Min guard, Supervision           General transfer comment: STS from stretcher bed with CGA on first attempt, supervision 2nd attempt    Ambulation/Gait Ambulation/Gait assistance: Supervision, Min guard Gait Distance (Feet):  (100 ft + 80 ft) Assistive device: None, Rolling walker (2 wheels) Gait Pattern/deviations: Decreased step length - left, Decreased step length - right, Decreased stride length, Decreased dorsiflexion - left Gait velocity: decreased     General Gait Details: Decreased heel strike LLE, decreased hip/knee flexion LLE during swing phase, decreased step length LLE.  Stairs            Wheelchair Mobility    Modified Rankin (Stroke Patients Only)       Balance Overall balance assessment: Needs assistance Sitting-balance support: Feet supported Sitting balance-Leahy Scale: Good Sitting balance - Comments: Pt able to don socks sitting EOB without LOB with supervision.   Standing balance support: During functional activity, No upper extremity supported Standing balance-Leahy Scale: Good  Pertinent Vitals/Pain Pain Assessment Pain Assessment: No/denies pain    Home Living Family/patient expects to be discharged to:: Private residence Living Arrangements: Alone Available  Help at Discharge: Friend(s);Available PRN/intermittently Type of Home: House Home Access: Level entry       Home Layout: One level Home Equipment: Conservation officer, nature (2 wheels)      Prior Function Prior Level of Function : Independent/Modified Independent             Mobility Comments: Pt reports he was independent without AD, denies other falls. ADLs Comments: Reports he bathes, dresses, cooks, cleans without assistance then later states a friend helps with cooking occasionally.     Hand Dominance        Extremity/Trunk Assessment   Upper Extremity Assessment Upper Extremity Assessment: Generalized weakness    Lower Extremity Assessment Lower Extremity Assessment: Generalized weakness       Communication   Communication: Expressive difficulties  Cognition Arousal/Alertness: Awake/alert Behavior During Therapy: WFL for tasks assessed/performed Overall Cognitive Status: Within Functional Limits for tasks assessed                                          General Comments      Exercises     Assessment/Plan    PT Assessment Patient needs continued PT services  PT Problem List Decreased strength;Decreased activity tolerance;Decreased balance;Decreased mobility;Decreased safety awareness;Decreased knowledge of use of DME;Decreased coordination       PT Treatment Interventions DME instruction;Therapeutic exercise;Balance training;Neuromuscular re-education;Stair training;Gait training;Functional mobility training;Therapeutic activities;Patient/family education    PT Goals (Current goals can be found in the Care Plan section)  Acute Rehab PT Goals Patient Stated Goal: return home PT Goal Formulation: With patient Time For Goal Achievement: 04/14/22 Potential to Achieve Goals: Good    Frequency 7X/week     Co-evaluation               AM-PAC PT "6 Clicks" Mobility  Outcome Measure Help needed turning from your back to your side while  in a flat bed without using bedrails?: None Help needed moving from lying on your back to sitting on the side of a flat bed without using bedrails?: A Little Help needed moving to and from a bed to a chair (including a wheelchair)?: A Little Help needed standing up from a chair using your arms (e.g., wheelchair or bedside chair)?: A Little Help needed to walk in hospital room?: A Little Help needed climbing 3-5 steps with a railing? : A Little 6 Click Score: 19    End of Session   Activity Tolerance: Patient tolerated treatment well Patient left: in chair;with call bell/phone within reach   PT Visit Diagnosis: Unsteadiness on feet (R26.81);Muscle weakness (generalized) (M62.81);Hemiplegia and hemiparesis Hemiplegia - Right/Left: Left Hemiplegia - dominant/non-dominant: Non-dominant Hemiplegia - caused by: Cerebral infarction    Time: [id number] PT Time Calculation (min) (ACUTE ONLY): 22 min   Charges:   PT Evaluation $PT Eval Moderate Complexity: 1 Mod          Lavone Nian, PT, DPT 03/31/22, 1:07 PM   Waunita Schooner 03/31/2022, 12:58 PM

## 2022-03-31 NOTE — Progress Notes (Signed)
*  PRELIMINARY RESULTS* Echocardiogram 2D Echocardiogram has been performed.  Noah Boone 03/31/2022, 9:55 AM

## 2022-03-31 NOTE — Assessment & Plan Note (Addendum)
New onset slurred speech as well as worsening generalized weakness x 5 days with subsequent fall Noted new small right internal capsule infarct on MRI On aspirin Plavix in the setting of prior CVAs per report Will plan for formal stroke evaluation including MRA of the head and neck, 2D echo, risk stratification labs Continue aspirin Plavix Start statin   Neurology formally consult Follow-up and formal neurology recommendations Follow

## 2022-03-31 NOTE — ED Triage Notes (Addendum)
BIB [facility] from home  Per family, pt fell out of bed while trying to go to bathroom and obtained laceration to R elbow. Family reported to EMS that pt had a stroke on Thursday but family did not take pt to be evaluated. Report confirmed stroke in November that pt was evaluated for.   EMS reports pt Alert and oriented. Ambulatory only with assistance.   138/60 HR 65 Spo2 97% RA RR 18

## 2022-03-31 NOTE — Assessment & Plan Note (Signed)
Positive follow home in setting of acute CVA with noted right elbow skin tear Fall precautions PT/OT evaluation

## 2022-03-31 NOTE — Assessment & Plan Note (Signed)
Cont home synthroid 

## 2022-03-31 NOTE — ED Notes (Signed)
SLP at bedside.

## 2022-03-31 NOTE — ED Notes (Signed)
Patient's right elbow has been dressed with Bacitracin Ointment, covered in Telfa, and wrapped in roll gauze followed by a layer of Coban.

## 2022-03-31 NOTE — Evaluation (Signed)
Clinical/Bedside Swallow Evaluation Patient Details  Name: Noah Boone MRN: [id number] Date of Birth: 01-18-1929  Today's Date: 03/31/2022 Time: SLP Start Time (ACUTE ONLY): 24 SLP Stop Time (ACUTE ONLY): 1410 SLP Time Calculation (min) (ACUTE ONLY): 50 min  Past Medical History:  Past Medical History:  Diagnosis Date   Arthritis    GERD (gastroesophageal reflux disease)    Hypercholesteremia    Hypertension    Stroke [facility])    Past Surgical History:  Past Surgical History:  Procedure Laterality Date   CATARACT EXTRACTION W/PHACO Left 08/19/2016   Procedure: CATARACT EXTRACTION PHACO AND INTRAOCULAR LENS PLACEMENT ([place]);  Surgeon: Birder Robson, MD;  Location: [facility] ORS;  Service: Ophthalmology;  Laterality: Left;  [place] 00:59 AP% 15.5 CDE 9.23 fluid pack lot # [id number]   CATARACT EXTRACTION W/PHACO Right 09/09/2016   Procedure: CATARACT EXTRACTION PHACO AND INTRAOCULAR LENS PLACEMENT ([facility]);  Surgeon: Birder Robson, MD;  Location: [facility] ORS;  Service: Ophthalmology;  Laterality: Right;  [place] 00:39 AP% 16.3 CDE 6.36 Fluid pack lot 3 [id number]   COLONOSCOPY     EYE SURGERY     TONSILLECTOMY     HPI:  Pt is a 89 y.o. male with medical history significant of GERD, hypertension hyperlipidemia, stroke ~64mos and ~10 years ago who presented with fall and new CVA.  Patient reports worsening weakness over the past 4 to 5 days.  Also with slurred speech during this timeframe.  No remote history of CVA approximately 10 years ago.  Patient also noted to have had another CVA event in November of last year to which patient refused admission.  Patient was placed on aspirin and Plavix at that time with recommendation for outpatient neurology follow-up.  In review of the chart, the patient had follow-up with neurology at [facility] clinic with recommendation for cardiovascular imaging and cardiology referral.  Patient appears to not have had evaluation.  Patient does report taking aspirin and  Plavix daily.  Lives at home alone.  Does have chronic weakness.  MRI:  Acute small vessel infarct at the right internal capsule.  Chronic infarct in the same location on the left. Generalized  cerebral volume loss with ventriculomegaly. No acute hemorrhage.    Assessment / Plan / Recommendation  Clinical Impression   Pt seen for BSE today. Pt awake, verbal w/ mild-mod Dysarthria. Pt answered general questions appropriately and followed all commands given instruction. A/O x3. He stated he was aware he had had a stroke(per MD report) and could identify having had one ~6 months ago also in which he stated his speech was "slurred" then.  On RA, afebrile. WBC 10.9.  OF NOTE: Pt presented w/ slightly wet vocal quality at Baseline PRIOR TO po's given. Pt could follow through w/ a cued throat clear which lessened the wet vocal quality briefly.  Pt appears to present w/ oropharyngeal phase dysphagia w/ oropharyngeal phase swallowing deficits and suspected sensorimotor impact in setting of acute CVA. He also presented w/ Dysarthria impacting articulation of speech w/ lingual weakness identified during OM exam. Pt consumed po trials including modified liquid consistency demonstrating functional swallowing of consistencies given.  Pt appears at increased risk for aspiration/aspiration pneumonia currently. Risk can be reduced following aspiration precautions and strategies and using a modified Dysphagia diet w/ Nectar consistency liquids; ongoing assessment and trials to upgrade as indicated.    During assessment, pt consumed trials of single ice chips w/ intermittent throat clearing(immediate) but no apparent decline in respiratory status. He then consumed trials of  Nectar liquids via tsp/Cup, purees, and soft/moistened solids feeding himself w/ intermittent cues w/ no Immediate, overt clinical s/s of aspiration noted: no cough and no decline in respiratory presentation noted during/post trials. Pt continued to  exhibit a slightly wet vocal quality intermittently b/t trials as was present PRIOR TO po's. Suspect potential decreased laryngeal excursion during the swallowing which could result in pharyngeal residue b/t trials, including saliva. Also suspect decreased attention to his vocal quality as he did not immediately utilize throat clearing to clear. Oral phase was c/b grossly functional bolus management, mastication,a nd A-P transfer for swallowing. Mildly decreased lingual movements/strength resulting in diffuse lingual residue w/ soft solid trials which pt cleared w/ lingual sweep/dry swallow and alternating foods/liquids. Time b/t trials given for pt to fully clear solid foods. Pt was able to ID need to use lingual sweeping b/t foods.  OM exam revealed functional lingual strength in isolation, though mildly reduced lingual elevation and tongue tip strength. Posterior lingual strength WFL. No labial ROM/weakness noted. Cough weak; Fair-good throat clear. Pt fed self - needed an intermittent cue to slow down and utilize the strategies.   Recommend dysphagia level 3 diet(cut/chopped meats moistened) w/ Nectar consistency liquids; aspiration precautions; swallowing strategies of lingual sweep/dry swallow. Encouraged pt to use effortful swallowing. Pills Whole vs Crushed in puree for safety; tray setup/sitting up at meals; reduce Distractions during meals.  ST services will f/u w/ ongoing assessment of swallowing. Pt may have Pleasure, single ice chips w/ NSG post oral care w/ aspiration precautions -- PRIOR to meals only. NSG/MD updated and agreed. Pt agreed w/ above. SLP Visit Diagnosis: Dysphagia, oropharyngeal phase (R13.12)    Aspiration Risk  Mild aspiration risk;Moderate aspiration risk;Risk for inadequate nutrition/hydration    Diet Recommendation   dysphagia level 3 diet(cut/chopped meats moistened) w/ Nectar consistency liquids; aspiration precautions; swallowing strategies of lingual sweep/dry  swallow. Encouraged pt to use effortful swallowing. Tray setup at meals and sitting up; reduce Distractions during meals.  Medication Administration: Whole meds with puree    Other  Recommendations Recommended Consults:  (Dietician f/u) Oral Care Recommendations: Oral care BID;Oral care before and after PO;Patient independent with oral care (setup) Caregiver Recommendations: Avoid jello, ice cream, thin soups, popsicles;Remove water pitcher;Have oral suction available    Recommendations for follow up therapy are one component of a multi-disciplinary discharge planning process, led by the attending physician.  Recommendations may be updated based on patient status, additional functional criteria and insurance authorization.  Follow up Recommendations  (TBD)      Assistance Recommended at Discharge  intermittent  Functional Status Assessment Patient has had a recent decline in their functional status and demonstrates the ability to make significant improvements in function in a reasonable and predictable amount of time.  Frequency and Duration min 2x/week  2 weeks       Prognosis Prognosis for improved oropharyngeal function: Fair (-Good) Barriers to Reach Goals: Time post onset;Severity of deficits;Motivation Barriers/Prognosis Comment: pt reported previous Dysarthria w/ CVA and min weakness since then      Swallow Study   General Date of Onset: 03/31/22 HPI: Pt is a 89 y.o. male with medical history significant of GERD, hypertension hyperlipidemia, stroke ~78mos and ~10 years ago who presented with fall and new CVA.  Patient reports worsening weakness over the past 4 to 5 days.  Also with slurred speech during this timeframe.  No remote history of CVA approximately 10 years ago.  Patient also noted to have had another CVA  event in November of last year to which patient refused admission.  Patient was placed on aspirin and Plavix at that time with recommendation for outpatient neurology  follow-up.  In review of the chart, the patient had follow-up with neurology at [facility] clinic with recommendation for cardiovascular imaging and cardiology referral.  Patient appears to not have had evaluation.  Patient does report taking aspirin and Plavix daily.  Lives at home alone.  Does have chronic weakness.  MRI:  Acute small vessel infarct at the right internal capsule.  Chronic infarct in the same location on the left. Generalized  cerebral volume loss with ventriculomegaly. No acute hemorrhage. Type of Study: Bedside Swallow Evaluation Previous Swallow Assessment: none Diet Prior to this Study: NPO Temperature Spikes Noted: No (wbc 10.9) Respiratory Status: Room air History of Recent Intubation: No Behavior/Cognition: Alert;Cooperative;Pleasant mood (dysarthria) Oral Cavity Assessment:  (sticky) Oral Care Completed by SLP: Yes Oral Cavity - Dentition: Adequate natural dentition Vision: Functional for self-feeding Self-Feeding Abilities: Able to feed self;Needs set up Patient Positioning: Upright in chair Baseline Vocal Quality: Normal;Low vocal intensity (dysarthria) Volitional Cough: weak; Fair-good throat clear (some of this may be baseline per pt report) Volitional Swallow: Able to elicit (mildly reduced in effort)    Oral/Motor/Sensory Function Overall Oral Motor/Sensory Function: Mild impairment Facial ROM: Within Functional Limits Facial Symmetry: Within Functional Limits Facial Strength: Within Functional Limits Lingual ROM: Within Functional Limits Lingual Symmetry: Within Functional Limits Lingual Strength: Reduced (anterior; posterior) Velum: Within Functional Limits (fair) Mandible: Within Functional Limits   Ice Chips Ice chips: Impaired (slight) Presentation: Spoon (fed; 8 trials) Oral Phase Impairments:  (functional) Pharyngeal Phase Impairments: Throat Clearing - Immediate (x2)   Thin Liquid Thin Liquid: Not tested    Nectar Thick Nectar Thick Liquid:  Within functional limits Presentation: Cup;Self Fed;Spoon (10 trials via tsp; ~3 ozs via cup) Other Comments: no increased wet vocal quality from his baseline   Honey Thick Honey Thick Liquid: Not tested   Puree Puree: Within functional limits Presentation: Self Fed;Spoon (10 trials) Other Comments: no increased wet vocal quality from his baseline   Solid     Solid: Within functional limits (moistened foods) Presentation: Self Fed (8 trials) Other Comments: no increased wet vocal quality from his baseline         Orinda Kenner, MS, CCC-SLP Speech Language Pathologist Rehab Services; [place] [phone] (ascom) Peggye Poon 03/31/2022,2:35 PM

## 2022-03-31 NOTE — Assessment & Plan Note (Signed)
Cont protonix

## 2022-03-31 NOTE — ED Notes (Signed)
ECHO at bedside.

## 2022-03-31 NOTE — ED Triage Notes (Signed)
See first RN note. Pt's family reports hx of previous stroke, reports Thursday pt started having severly slurred speech, and reports possible stroke on Thursday however did not have patient evaluated. Pt's family member reports that patient's speech has markedly improved since Thursday.

## 2022-03-31 NOTE — Assessment & Plan Note (Signed)
BP stable Allow for permissive hypertension in the setting of acute CVA As needed labetalol for systolic blood pressure greater than [id number] or diastolic blood pressure greater than 110

## 2022-03-31 NOTE — ED Provider Notes (Addendum)
[facility] Provider Note    Event Date/Time   First MD Initiated Contact with Patient 03/31/22 [phone]     (approximate)   History   Fall and Altered Mental Status   HPI  Noah Boone is a 89 y.o. male history of previous CVA, hypertension, hyperlipidemia who presents to the emergency department with slurred speech that has been ongoing since Thursday, March 14.  No other numbness, tingling, weakness.  States this morning he fell getting out of bed and has a skin tear to the right elbow.  Unclear if he hit his head.  States he thinks he is on blood thinners but does not recall what he is taking.  It looks like he is on Plavix.  He is complaining of some lower back pain.  Lives at home alone.  Does not use a cane or walker with ambulation.   History provided by patient.    Past Medical History:  Diagnosis Date   Arthritis    GERD (gastroesophageal reflux disease)    Hypercholesteremia    Hypertension    Stroke [facility])     Past Surgical History:  Procedure Laterality Date   CATARACT EXTRACTION W/PHACO Left 08/19/2016   Procedure: CATARACT EXTRACTION PHACO AND INTRAOCULAR LENS PLACEMENT ([facility]);  Surgeon: Birder Robson, MD;  Location: [facility] ORS;  Service: Ophthalmology;  Laterality: Left;  [place] 00:59 AP% 15.5 CDE 9.23 fluid pack lot # [id number]   CATARACT EXTRACTION W/PHACO Right 09/09/2016   Procedure: CATARACT EXTRACTION PHACO AND INTRAOCULAR LENS PLACEMENT ([facility]);  Surgeon: Birder Robson, MD;  Location: [facility] ORS;  Service: Ophthalmology;  Laterality: Right;  [place] 00:39 AP% 16.3 CDE 6.36 Fluid pack lot 3 [id number]   COLONOSCOPY     EYE SURGERY     TONSILLECTOMY      MEDICATIONS:  Prior to Admission medications   Medication Sig Start Date End Date Taking? Authorizing Provider  amLODipine (NORVASC) 5 MG tablet Take 5 mg by mouth at bedtime.     [provider]  clopidogrel (PLAVIX) 75 MG tablet Take 75 mg by mouth daily.    [provider]  Difluprednate (DUREZOL) 0.05 % EMUL Place 1 drop into the right eye 2 (two) times daily. Patient not taking: Reported on 12/04/2021    [provider]  ILEVRO 0.3 % ophthalmic suspension Place 1 drop into the right eye daily. Patient not taking: Reported on 12/04/2021 08/11/16   [provider]  levothyroxine (SYNTHROID) 50 MCG tablet Take 50 mcg by mouth daily.    [provider]  lovastatin (MEVACOR) 40 MG tablet Take 40 mg by mouth at bedtime.    [provider]  pantoprazole (PROTONIX) 40 MG tablet Take 40 mg by mouth daily.    [provider]    Physical Exam   Triage Vital Signs: ED Triage Vitals  Enc Vitals Group     BP 03/31/22 0418 (!) 138/55     Pulse Rate 03/31/22 0418 64     Resp 03/31/22 0418 18     Temp 03/31/22 0418 98.3 F (36.8 C)     Temp Source 03/31/22 0418 Oral     SpO2 03/31/22 0418 96 %     Weight 03/31/22 0421 149 lb 14.6 oz (68 kg)     Height 03/31/22 0421 5\' 8"  (1.727 m)     Head Circumference --      Peak Flow --      Pain Score 03/31/22 0421 0  Pain Loc --      Pain Edu? --      Excl. in [place]? --     Most recent vital signs: Vitals:   03/31/22 0418  BP: (!) 138/55  Pulse: 64  Resp: 18  Temp: 98.3 F (36.8 C)  SpO2: 96%    CONSTITUTIONAL: Alert, responds appropriately to questions.  Oriented x 3.  Elderly. HEAD: Normocephalic, atraumatic EYES: Conjunctivae clear, pupils appear equal, sclera nonicteric ENT: normal nose; moist mucous membranes NECK: Supple, normal ROM CARD: RRR; S1 and S2 appreciated RESP: Normal chest excursion without splinting or tachypnea; breath sounds clear and equal bilaterally; no wheezes, no rhonchi, no rales, no hypoxia or respiratory distress, speaking full sentences ABD/GI: Non-distended; soft, non-tender, no rebound, no guarding, no peritoneal signs BACK: The back appears normal, tender over the lower lumbar spine without step-off or deformity EXT:  Normal ROM in all joints; no deformity noted, no edema SKIN: Normal color for age and race; warm; no rash on exposed skin, skin tear noted to the right elbow NEURO: Moves all extremities equally, patient has dysarthria, no aphasia, no pronator drift, normal sensation diffusely, cranial nerves II through XII intact PSYCH: The patient's mood and manner are appropriate.   ED Results / Procedures / Treatments   LABS: (all labs ordered are listed, but only abnormal results are displayed) Labs Reviewed  CBC - Abnormal; Notable for the following components:      Result Value   WBC 10.9 (*)    RBC 4.07 (*)    Hemoglobin 12.5 (*)    HCT 38.9 (*)    All other components within normal limits  DIFFERENTIAL - Abnormal; Notable for the following components:   Monocytes Absolute 1.2 (*)    All other components within normal limits  COMPREHENSIVE METABOLIC PANEL - Abnormal; Notable for the following components:   Glucose, Bld 116 (*)    BUN 25 (*)    Creatinine, Ser 1.39 (*)    GFR, Estimated 48 (*)    All other components within normal limits  CBG MONITORING, ED - Abnormal; Notable for the following components:   Glucose-Capillary 107 (*)    All other components within normal limits  PROTIME-INR  APTT  ETHANOL  URINALYSIS, ROUTINE W REFLEX MICROSCOPIC     EKG:  EKG Interpretation  Date/Time:  Monday March 31 2022 04:20:44 EDT Ventricular Rate:  64 PR Interval:  162 QRS Duration: 138 QT Interval:  462 QTC Calculation: 476 R Axis:   -61 Text Interpretation: Normal sinus rhythm Left axis deviation Left bundle branch block Abnormal ECG When compared with ECG of 04-Dec-2021 08:59, QRS axis Shifted left T wave inversion now evident in Lateral leads Confirmed by Pryor Curia [phone]) on 03/31/2022 6:43:10 AM         RADIOLOGY: My personal review and interpretation of imaging: CT head concerning for an acute lacunar infarct.  I have personally reviewed all radiology reports.   MR  BRAIN WO CONTRAST  Result Date: 03/31/2022 CLINICAL DATA:  Neuro deficit with acute stroke suspected EXAM: MRI HEAD WITHOUT CONTRAST TECHNIQUE: Multiplanar, multiecho pulse sequences of the brain and surrounding structures were obtained without intravenous contrast. COMPARISON:  12/04/2021 FINDINGS: Brain: Acute small vessel infarct at the right internal capsule. Chronic infarct in the same location on the left. Generalized cerebral volume loss with ventriculomegaly. No acute hemorrhage, obstructive hydrocephalus, mass, or collection. Ischemic gliosis in the cerebral white matter is mild for age. Vascular: Major flow voids are preserved Skull and  upper cervical spine: Cervical facet spurring asymmetric to the right where covered. No focal lesion. Sinuses/Orbits: Negative Other: Intermittent motion artifact. Fast brain protocol intermittently use. IMPRESSION: Acute small vessel infarct in the right internal capsule. Electronically Signed   By: Jorje Guild M.D.   On: 03/31/2022 06:40   CT HEAD WO CONTRAST  Result Date: 03/31/2022 CLINICAL DATA:  Slurred speech. EXAM: CT HEAD WITHOUT CONTRAST TECHNIQUE: Contiguous axial images were obtained from the base of the skull through the vertex without intravenous contrast. RADIATION DOSE REDUCTION: This exam was performed according to the departmental dose-optimization program which includes automated exposure control, adjustment of the mA and/or kV according to patient size and/or use of iterative reconstruction technique. COMPARISON:  12/04/2021 brain MRI FINDINGS: Brain: Low-density at the posterior limb right internal capsule since prior, fairly well-defined but still possibly acute. Chronic perforator infarct at the left internal capsule known from prior imaging. No evidence of cortical infarct. No hemorrhage, hydrocephalus, or collection. Vascular: No hyperdense vessel or unexpected calcification. Skull: Normal. Negative for fracture or focal lesion.  Sinuses/Orbits: No acute finding. IMPRESSION: Interval lacunar infarct at the right internal capsule since 12/04/2021, possibly acute in this setting. Electronically Signed   By: Jorje Guild M.D.   On: 03/31/2022 04:46     PROCEDURES:  Critical Care performed: No     .1-3 Lead EKG Interpretation  Performed by: Tameko Halder, Delice Bison, DO Authorized by: Sharanya Templin, Delice Bison, DO     Interpretation: normal     ECG rate:  64   ECG rate assessment: normal     Rhythm: sinus rhythm     Ectopy: none     Conduction: normal       IMPRESSION / MDM / ASSESSMENT AND PLAN / ED COURSE  I reviewed the triage vital signs and the nursing notes.    Patient here with complaints of slurred speech since Thursday and a fall out of bed this morning.  The patient is on the cardiac monitor to evaluate for evidence of arrhythmia and/or significant heart rate changes.   DIFFERENTIAL DIAGNOSIS (includes but not limited to):   CVA, TIA, UTI, anemia, electrolyte derangement, intracranial hemorrhage, concussion   Patient's presentation is most consistent with acute presentation with potential threat to life or bodily function.   PLAN: Workup initiated from triage.  Slight leukocytosis.  Hemoglobin 12.5 which is stable.  Normal electrolytes.  Stable chronic kidney disease.  Alcohol level negative.  Urine pending.  CT of the head reviewed and interpreted by myself and the radiologist and is concerning for an acute lacunar infarct.  Will obtain MRI of the brain and discussed with hospitalist for admission.  Is complaining of some mild lower back pain from his fall.  Will obtain a lumbar x-ray.  He has a skin tear of the right elbow which we will clean and dress.  Will update his tetanus vaccine.  He declines any pain medicine at this time.   MEDICATIONS GIVEN IN ED: Medications  bacitracin ointment (has no administration in time range)  Tdap (BOOSTRIX) injection 0.5 mL (has no administration in time range)  0.9 %   sodium chloride infusion (has no administration in time range)  ondansetron (ZOFRAN) injection 4 mg (has no administration in time range)     ED COURSE: MRI reviewed and interpreted by myself and the radiologist and confirms an acute infarct.  Outside of tPA window.  Will admit.   Consulted and discussed patient's case with hospitalist, Dr. Damita Dunnings.  I  have recommended admission and consulting physician agrees and will place admission orders.  Patient (and family if present) agree with this plan.   I reviewed all nursing notes, vitals, pertinent previous records.  All labs, EKGs, imaging ordered have been independently reviewed and interpreted by myself.       OUTSIDE RECORDS REVIEWED: Reviewed last PCP note on 12/24/2021.       FINAL CLINICAL IMPRESSION(S) / ED DIAGNOSES   Final diagnoses:  Cerebrovascular accident (CVA), unspecified mechanism ([place])  Dysarthria  Fall, initial encounter  Skin tear of elbow without complication, right, initial encounter     Rx / DC Orders   ED Discharge Orders     None        Note:  This document was prepared using Dragon voice recognition software and may include unintentional dictation errors.   Balinda Heacock, Delice Bison, DO 03/31/22 [place], Delice Bison, DO 03/31/22 [phone]

## 2022-03-31 NOTE — ED Notes (Signed)
Patient transported to MRI 

## 2022-03-31 NOTE — Evaluation (Signed)
Occupational Therapy Evaluation Patient Details Name: Noah Boone MRN: [id number] DOB: [birth year] Today's Date: 03/31/2022   History of Present Illness Pt is a 89 y/o M admitted on 03/31/22 after presenting with c/o worsening weakness over the past 4-5 days, as well as slurred speech. 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 PMH: GERD, HTN, HLD, stroke   Clinical Impression   Noah Boone was seen for OT evaluation this date. Prior to hospital admission, pt was IND. Pt lives alone. Pt presents to acute OT demonstrating impaired ADL performance and functional mobility 2/2 decreased activity tolerance and functional balance deficits. Pt currently requires SUPERVISION + RW functional mobility ~60 ft, SBA no AD use ~10 ft in room mobility. MOD I don/doff B socks seated in chair. SBA for clothing mgmt in standing. Pt would benefit from skilled OT to address noted impairments and functional limitations (see below for any additional details). Upon hospital discharge, recommend HHOT to maximize pt safety and return to PLOF.    Recommendations for follow up therapy are one component of a multi-disciplinary discharge planning process, led by the attending physician.  Recommendations may be updated based on patient status, additional functional criteria and insurance authorization.   Follow Up Recommendations  Home health OT     Assistance Recommended at Discharge Intermittent Supervision/Assistance  Patient can return home with the following Help with stairs or ramp for entrance;Assistance with cooking/housework    Functional Status Assessment  Patient has had a recent decline in their functional status and demonstrates the ability to make significant improvements in function in a reasonable and predictable amount of time.  Equipment Recommendations  None recommended by OT    Recommendations for Other Services       Precautions / Restrictions Precautions Precautions:  Fall Restrictions Weight Bearing Restrictions: No      Mobility Bed Mobility    Not tested                Transfers Overall transfer level: Needs assistance Equipment used: None, Rolling walker (2 wheels) Transfers: Sit to/from Stand Sit to Stand: Supervision           General transfer comment: trialed with and without RW, cues to push from chair arm rests.      Balance Overall balance assessment: Needs assistance Sitting-balance support: Feet supported Sitting balance-Leahy Scale: Good     Standing balance support: During functional activity, No upper extremity supported Standing balance-Leahy Scale: Good                             ADL either performed or assessed with clinical judgement   ADL Overall ADL's : Needs assistance/impaired                                       General ADL Comments: SBA + RW simulated toilet t/f. MOD I don/doff B socks seated in chair. SBA for clothing mgmt in standing      Pertinent Vitals/Pain Pain Assessment Pain Assessment: No/denies pain     Hand Dominance     Extremity/Trunk Assessment Upper Extremity Assessment Upper Extremity Assessment: Generalized weakness   Lower Extremity Assessment Lower Extremity Assessment: Generalized weakness       Communication Communication Communication: Expressive difficulties;HOH   Cognition Arousal/Alertness: Awake/alert Behavior During Therapy: WFL for tasks assessed/performed Overall Cognitive Status: Within Functional Limits for  tasks assessed                                        Home Living Family/patient expects to be discharged to:: Private residence Living Arrangements: Alone Available Help at Discharge: Friend(s);Available PRN/intermittently Type of Home: House Home Access: Level entry     Home Layout: One level     Bathroom Shower/Tub: Walk-in shower         Home Equipment: Conservation officer, nature (2  wheels);Shower seat          Prior Functioning/Environment Prior Level of Function : Independent/Modified Independent             Mobility Comments: per neighbour pt has hx of 3 falls this year ADLs Comments: Reports he bathes, dresses, cooks, cleans without assistance then later states a friend helps with cooking occasionally.        OT Problem List: Decreased strength;Decreased activity tolerance;Decreased safety awareness;Impaired balance (sitting and/or standing)      OT Treatment/Interventions: Self-care/ADL training;Therapeutic exercise;Energy conservation;DME and/or AE instruction;Therapeutic activities;Balance training;Patient/family education    OT Goals(Current goals can be found in the care plan section) Acute Rehab OT Goals Patient Stated Goal: to go home OT Goal Formulation: With patient Time For Goal Achievement: 04/14/22 Potential to Achieve Goals: Good ADL Goals Pt Will Perform Grooming: Independently;standing Pt Will Perform Lower Body Dressing: sit to/from stand;Independently Pt Will Transfer to Toilet: ambulating;regular height toilet;with modified independence  OT Frequency: Min 3X/week    Co-evaluation              AM-PAC OT "6 Clicks" Daily Activity     Outcome Measure Help from another person eating meals?: None Help from another person taking care of personal grooming?: A Little Help from another person toileting, which includes using toliet, bedpan, or urinal?: A Little Help from another person bathing (including washing, rinsing, drying)?: A Little Help from another person to put on and taking off regular upper body clothing?: None Help from another person to put on and taking off regular lower body clothing?: A Little 6 Click Score: 20   End of Session    Activity Tolerance: Patient tolerated treatment well Patient left: in chair;with call bell/phone within reach;with family/visitor present  OT Visit Diagnosis: Other abnormalities of  gait and mobility (R26.89);Muscle weakness (generalized) (M62.81)                Time: [id number] OT Time Calculation (min): 14 min Charges:  OT General Charges $OT Visit: 1 Visit OT Evaluation $OT Eval Low Complexity: 1 Low Dessie Coma, M.S. OTR/L  03/31/22, 2:58 PM  ascom [phone]

## 2022-03-31 NOTE — Consult Note (Signed)
NEURO HOSPITALIST CONSULT NOTE   Requestig physician: Dr. Fabio Neighbors  Reason for Consult: New onset of dysarthria  History obtained from:  Patient and Chart     HPI:                                                                                                                                          Noah Boone is an 89 y.o. male with a PMHx of arthritis, GERD, hypercholesterolemia, HTN and stroke last November who presented to the ED early this morning after falling out of bed, lacerating his right elbow. When EMS arrived, family reported to them that he had what they though was a stroke due to acute onset of slurred speech on Thursday, but that they did not take him to be evaluated. Family also endorsed weakness over the past 5 days. He was alert and oriented on arrival to the ED. Per family, his slurred speech was initially severe at onset on Thursday but has improved since then, although he is still not back to baseline. MRI brain in the ED revealed a subacute lacunar infarction involving the right internal capsule. His home medications include ASA and Plavix.    Past Medical History:  Diagnosis Date   Arthritis    GERD (gastroesophageal reflux disease)    Hypercholesteremia    Hypertension    Stroke [facility])     Past Surgical History:  Procedure Laterality Date   CATARACT EXTRACTION W/PHACO Left 08/19/2016   Procedure: CATARACT EXTRACTION PHACO AND INTRAOCULAR LENS PLACEMENT (IOC);  Surgeon: Birder Robson, MD;  Location: [facility] ORS;  Service: Ophthalmology;  Laterality: Left;  [place] 00:59 AP% 15.5 CDE 9.23 fluid pack lot # [id number]   CATARACT EXTRACTION W/PHACO Right 09/09/2016   Procedure: CATARACT EXTRACTION PHACO AND INTRAOCULAR LENS PLACEMENT (IOC);  Surgeon: Birder Robson, MD;  Location: [facility] ORS;  Service: Ophthalmology;  Laterality: Right;  [place] 00:39 AP% 16.3 CDE 6.36 Fluid pack lot 3 [id number]   COLONOSCOPY     EYE SURGERY     TONSILLECTOMY       History reviewed. No pertinent family history.             Social History:  reports that he quit smoking about 51 years ago. He has quit using smokeless tobacco. He reports current alcohol use. He reports that he does not use drugs.  No Known Allergies  MEDICATIONS:  Prior to Admission:  Medications Prior to Admission  Medication Sig Dispense Refill Last Dose   amLODipine (NORVASC) 5 MG tablet Take 5 mg by mouth at bedtime.    03/30/2022   aspirin EC 81 MG tablet Take 81 mg by mouth daily. Swallow whole.   03/30/2022   clopidogrel (PLAVIX) 75 MG tablet Take 75 mg by mouth daily.   03/30/2022   levothyroxine (SYNTHROID) 50 MCG tablet Take 50 mcg by mouth daily.   03/30/2022   lovastatin (MEVACOR) 40 MG tablet Take 40 mg by mouth at bedtime.   03/30/2022   pantoprazole (PROTONIX) 40 MG tablet Take 40 mg by mouth daily.   03/30/2022   Difluprednate (DUREZOL) 0.05 % EMUL Place 1 drop into the right eye 2 (two) times daily. (Patient not taking: Reported on 12/04/2021)      ILEVRO 0.3 % ophthalmic suspension Place 1 drop into the right eye daily. (Patient not taking: Reported on 12/04/2021)  0    Scheduled:  [START ON 04/01/2022]  stroke: early stages of recovery book   Does not apply Once   aspirin EC  81 mg Oral Daily   atorvastatin  80 mg Oral Daily   clopidogrel  75 mg Oral Daily   [START ON 04/01/2022] levothyroxine  50 mcg Oral Q0600   pantoprazole  40 mg Oral Daily   Continuous:  sodium chloride 75 mL/hr at 03/31/22 1837     ROS:                                                                                                                                       As per HPI. The patient does not endorse any additional complaints at the time of neurology evaluation.    Blood pressure (!) 144/66, pulse (!) 50, temperature 98.4 F (36.9 C), resp. rate 15, height 5\' 8"   (1.727 m), weight 68 kg, SpO2 96 %.   General Examination:                                                                                                       Physical Exam HEENT-  [place]/AT   Lungs- Respirations unlabored Extremities- Warm and well-perfused  Neurological Examination Mental Status: Awake and alert. Oriented to the city, state, year and day of the week, but thinks that the month is "November". Speech is dysarthric and with frequent stuttering, but there are no errors of grammar or syntax. Naming and comprehension are intact. Able to follow all commands  without difficulty. Cranial Nerves: II: Temporal visual fields intact with no extinction to DSS. PERRL. III,IV, VI: No ptosis. EOMI. No nystagmus. V: Temp sensation equal bilaterally VII: Smile symmetric VIII: Hearing intact to voice IX,X: Mildly hypophonic speech XI: Symmetric XII: Midline tongue; lingual dysarthria is noted.  Motor: RUE: 5/5 RLE: 5/5 LUE: 5/5 LLE: 5/5 Sensory: FT intact x 4. No extinction to DSS. Deep Tendon Reflexes: 2+ and symmetric bilateral biceps, brachioradialis and patellae Cerebellar: No ataxia with FNF bilaterally Gait: Deferred   Lab Results: Basic Metabolic Panel: Recent Labs  Lab 03/31/22 0425  NA 140  K 4.4  CL 100  CO2 27  GLUCOSE 116*  BUN 25*  CREATININE 1.39*  CALCIUM 9.7    CBC: Recent Labs  Lab 03/31/22 0425  WBC 10.9*  NEUTROABS 6.6  HGB 12.5*  HCT 38.9*  MCV 95.6  PLT 226    Cardiac Enzymes: No results for input(s): "CKTOTAL", "CKMB", "CKMBINDEX", "TROPONINI" in the last 168 hours.  Lipid Panel: No results for input(s): "CHOL", "TRIG", "HDL", "CHOLHDL", "VLDL", "LDLCALC" in the last 168 hours.  Imaging: DG Lumbar Spine Complete  Result Date: 03/31/2022 CLINICAL DATA:  Fall from bed EXAM: LUMBAR SPINE - COMPLETE 4 VIEW COMPARISON:  None Available. FINDINGS: Generalized disc space narrowing with mild endplate and facet spurring. No evidence of  fracture or bone lesion. Slight dextrocurvature. Subjective osteopenia. IMPRESSION: No acute or focal finding. Electronically Signed   By: Jorje Guild M.D.   On: 03/31/2022 07:10   MR BRAIN WO CONTRAST  Result Date: 03/31/2022 CLINICAL DATA:  Neuro deficit with acute stroke suspected EXAM: MRI HEAD WITHOUT CONTRAST TECHNIQUE: Multiplanar, multiecho pulse sequences of the brain and surrounding structures were obtained without intravenous contrast. COMPARISON:  12/04/2021 FINDINGS: Brain: Acute small vessel infarct at the right internal capsule. Chronic infarct in the same location on the left. Generalized cerebral volume loss with ventriculomegaly. No acute hemorrhage, obstructive hydrocephalus, mass, or collection. Ischemic gliosis in the cerebral white matter is mild for age. Vascular: Major flow voids are preserved Skull and upper cervical spine: Cervical facet spurring asymmetric to the right where covered. No focal lesion. Sinuses/Orbits: Negative Other: Intermittent motion artifact. Fast brain protocol intermittently use. IMPRESSION: Acute small vessel infarct in the right internal capsule. Electronically Signed   By: Jorje Guild M.D.   On: 03/31/2022 06:40   CT HEAD WO CONTRAST  Result Date: 03/31/2022 CLINICAL DATA:  Slurred speech. EXAM: CT HEAD WITHOUT CONTRAST TECHNIQUE: Contiguous axial images were obtained from the base of the skull through the vertex without intravenous contrast. RADIATION DOSE REDUCTION: This exam was performed according to the departmental dose-optimization program which includes automated exposure control, adjustment of the mA and/or kV according to patient size and/or use of iterative reconstruction technique. COMPARISON:  12/04/2021 brain MRI FINDINGS: Brain: Low-density at the posterior limb right internal capsule since prior, fairly well-defined but still possibly acute. Chronic perforator infarct at the left internal capsule known from prior imaging. No evidence  of cortical infarct. No hemorrhage, hydrocephalus, or collection. Vascular: No hyperdense vessel or unexpected calcification. Skull: Normal. Negative for fracture or focal lesion. Sinuses/Orbits: No acute finding. IMPRESSION: Interval lacunar infarct at the right internal capsule since 12/04/2021, possibly acute in this setting. Electronically Signed   By: Jorje Guild M.D.   On: 03/31/2022 04:46     Assessment: 89 year old male with a PMHx of stroke and HTN who presents to the ED with a 5 day history of dysarthria. MRI reveals  a subacute lacunar infarction involving the right internal capsule.  - Exam reveals prominent dysarthria, without aphasia. Surprisingly given his new MRI finding, no focal weakness is noted on exam.  - CT head: Interval development of a lacunar infarct at the right internal capsule since 12/04/2021, possibly acute in this setting. - MRI brain: Acute small vessel infarct at the right internal capsule. Chronic infarct in the same location on the left. Generalized cerebral volume loss with ventriculomegaly. Ischemic gliosis in the cerebral white matter is mild for age. - MRA of head and neck: No large vessel occlusion or hemodynamically significant stenosis in the head or neck. Tortuous cervical ICAs bilaterally. - TTE: LVEF 55 to 60%. The left ventricle has normal function with no regional  wall motion abnormalities. Left ventricular diastolic parameters are consistent with Grade I diastolic dysfunction (impaired relaxation). No mural thrombus or valvular vegetation mentioned in the report.  - EKG: NSR, left axis deviation, LBBB.   Recommendations: - Cardiac telemetry - Continue ASA and Plavix - Continue lovastatin - Unable to add new stroke prevention medications as he is already on maximal medical therapy.  - BP management. Out of the permissive HTN time window.  - PT/OT/Speech.  - Daily exercise and optimal diet. Encourage PO hydration.  - Outpatient Neurology follow  up.  - Work up is complete. Neurohospitalist service will sign off. Please call if there are additional questions.     Electronically signed: Dr. Kerney Elbe 03/31/2022, 10:10 AM

## 2022-03-31 NOTE — ED Notes (Signed)
Assumed care from Heather, RN. Pt resting comfortably in bed at this time. Pt denies any current needs or questions. Call light with in reach.   

## 2022-03-31 NOTE — Evaluation (Signed)
Speech Language Pathology Evaluation Patient Details Name: Noah Boone MRN: [id number] DOB: 08-Feb-1929 Today's Date: 03/31/2022 Time: 1300-1330 SLP Time Calculation (min) (ACUTE ONLY): 30 min  Problem List:  Patient Active Problem List   Diagnosis Date Noted   Acute CVA (cerebrovascular accident) ([place]) 03/31/2022   Fall at home, initial encounter 03/31/2022   Hypothyroidism 03/31/2022   GERD (gastroesophageal reflux disease) 03/31/2022   AKI (acute kidney injury) ([place]) 03/31/2022   HTN (hypertension) 03/31/2022   CVA (cerebral vascular accident) ([place]) 12/04/2021   Past Medical History:  Past Medical History:  Diagnosis Date   Arthritis    GERD (gastroesophageal reflux disease)    Hypercholesteremia    Hypertension    Stroke [facility])    Past Surgical History:  Past Surgical History:  Procedure Laterality Date   CATARACT EXTRACTION W/PHACO Left 08/19/2016   Procedure: CATARACT EXTRACTION PHACO AND INTRAOCULAR LENS PLACEMENT ([place]);  Surgeon: Birder Robson, MD;  Location: [facility] ORS;  Service: Ophthalmology;  Laterality: Left;  [place] 00:59 AP% 15.5 CDE 9.23 fluid pack lot # [id number]   CATARACT EXTRACTION W/PHACO Right 09/09/2016   Procedure: CATARACT EXTRACTION PHACO AND INTRAOCULAR LENS PLACEMENT ([facility]);  Surgeon: Birder Robson, MD;  Location: [facility] ORS;  Service: Ophthalmology;  Laterality: Right;  [place] 00:39 AP% 16.3 CDE 6.36 Fluid pack lot 3 [id number]   COLONOSCOPY     EYE SURGERY     TONSILLECTOMY     HPI:  Pt is a 89 y.o. male with medical history significant of GERD, hypertension hyperlipidemia, stroke ~50mos and ~10 years ago who presented with fall and new CVA.  Patient reports worsening weakness over the past 4 to 5 days.  Also with slurred speech during this timeframe.  No remote history of CVA approximately 10 years ago.  Patient also noted to have had another CVA event in November of last year to which patient refused admission.  Patient was placed on aspirin and  Plavix at that time with recommendation for outpatient neurology follow-up.  In review of the chart, the patient had follow-up with neurology at [facility] clinic with recommendation for cardiovascular imaging and cardiology referral.  Patient appears to not have had evaluation.  Patient does report taking aspirin and Plavix daily.  Lives at home alone.  Does have chronic weakness.  MRI:  Acute small vessel infarct at the right internal capsule.  Chronic infarct in the same location on the left. Generalized  cerebral volume loss with ventriculomegaly. No acute hemorrhage.   Assessment / Plan / Recommendation Clinical Impression   Pt seen for informal cognitive-linguistic and motor speech assessment today. Pt awake, verbal w/ mild-mod Dysarthria. Pt answered general questions appropriately and followed all commands given instruction. A/O x3. He stated he was aware he had had a stroke(per MD report) and could identify having had a stroke ~6 months ago in which he stated his speech was "slurred" then.  On RA, afebrile. WBC 10.9.  OF NOTE: Pt presented w/ slightly wet vocal quality at Baseline. Pt did not appear to be aware/hear such, and was given verbal cues to throat clear intermittently which lessened the wet vocal quality briefly.  Pt appears to present w/ mild-mod Dysarthria and min Motor Speech deficits c/b slight dysfluency at sentence-conversation level - slight hesitations noted. This was inconsistent and was not apparent when pt slowed down and took his time during shorter verbal responses. Pt's dysarthria impacts his verbal communication at the word-conversation level; he remained intelligible throughout. OM Exam revealed anterior-mod lingual weakness/decreased contact  during speech sounds (/t/, /k/). Lingual precision decreased during rapid, connected movement tasks (pa-ta-ka). When pt utilized strategies of slowing rate of speech, pausing to maintain breath support, over-articulating, and increased  volume/effort, pt's dysarthria was reduced and intelligibility increased. Practiced strategies and speech tasks briefly w/ pt encouraging him to speak slowly w/ effort.  Discussed the importance of sitting fully upright to support diahpragmatic breathing and breath support for speech, and safety w/ oral intake during meals.  No overt expressive or receptive aphasia noted during assessment at bedside, No overt cognitive deficits noted. Reviewed performances w/ OT/PT sessions.   Recommend pt have f/u during admit for ongoing education w/ speech strategies, and f/u w/ Outpatient skilled ST servcies for ongoing assessment and therapy to work on speech strategies/support to improve verbal communication in ADLs. CM/NSG/MD updated. Pt agreed.    SLP Assessment  SLP Recommendation/Assessment: Patient needs continued Speech [place] [facility] SLP Visit Diagnosis: Dysarthria and anarthria (R47.1)    Recommendations for follow up therapy are one component of a multi-disciplinary discharge planning process, led by the attending physician.  Recommendations may be updated based on patient status, additional functional criteria and insurance authorization.    Follow Up Recommendations  Follow physician's recommendations for discharge plan and follow up therapies (next venue of care)    Assistance Recommended at Discharge  Intermittent Supervision/Assistance  Functional Status Assessment Patient has had a recent decline in their functional status and/or demonstrates limited ability to make significant improvements in function in a reasonable and predictable amount of time  Frequency and Duration min 2x/week  2 weeks      SLP Evaluation Cognition  Overall Cognitive Status: Within Functional Limits for tasks assessed Arousal/Alertness: Awake/alert Orientation Level: Oriented X4 ("i've had a stroke") Year: 2024 Month: March Attention: Focused;Sustained Focused Attention: Appears  intact Sustained Attention: Appears intact Memory: Appears intact (to events of today) Awareness: Appears intact Problem Solving: Appears intact Executive Function: Reasoning (for swallow strategies) Reasoning: Appears intact Behaviors:  (n/a) Safety/Judgment: Appears intact Comments: fair       Comprehension  Auditory Comprehension Overall Auditory Comprehension: Appears within functional limits for tasks assessed Yes/No Questions: Within Functional Limits Commands: Within Functional Limits (1-2 step) Conversation: Simple Other Conversation Comments: dysarthria+ Interfering Components:  (n/a) Visual Recognition/Discrimination Discrimination: Not tested Reading Comprehension Reading Status: Not tested    Expression Expression Primary Mode of Expression: Verbal Verbal Expression Overall Verbal Expression: Appears within functional limits for tasks assessed Initiation: Impaired (slightly; inconsistent) Automatic Speech: Name;Social Response;Counting;Day of week;Month of year Level of Generative/Spontaneous Verbalization: Sentence Repetition: No impairment Naming: No impairment Pragmatics: No impairment Interfering Components: Speech intelligibility (dysarthria+) Effective Techniques: Semantic cues (taking his time) Non-Verbal Means of Communication: Not applicable Other Verbal Expression Comments: dysarthria+; old L CVA ~6 months ago Written Expression Dominant Hand: Right Written Expression: Not tested   Oral / Motor  Oral Motor/Sensory Function Overall Oral Motor/Sensory Function: Mild impairment (+) Facial ROM: Within Functional Limits Facial Symmetry: Within Functional Limits Facial Strength: Within Functional Limits Lingual ROM: Within Functional Limits Lingual Symmetry: Within Functional Limits Lingual Strength: Reduced (anterior; mid; quick connected movements) Velum: Within Functional Limits Mandible: Within Functional Limits Motor Speech Overall Motor  Speech: Impaired Respiration: Within functional limits Phonation: Low vocal intensity (advanced age) Resonance: Within functional limits (fair) Articulation: Impaired Level of Impairment: Word Intelligibility: Intelligibility reduced Word: 75-100% accurate Phrase: 75-100% accurate Sentence: 75-100% accurate Conversation: 75-100% accurate Motor Planning: Witnin functional limits (fair) Motor Speech Errors:  (not consistent but hesitations noted  intermittently) Effective Techniques: Slow rate;Increased vocal intensity;Over-articulate;Pause              Orinda Kenner, [place], CCC-SLP Speech Language Pathologist Rehab Services; [place] [phone] (ascom) Killian Schwer 03/31/2022, 5:20 PM

## 2022-03-31 NOTE — H&P (Addendum)
History and Physical    Patient: Noah Boone [id number] DOB: 1929-04-14 DOA: 03/31/2022 DOS: the patient was seen and examined on 03/31/2022 PCP: Adin Hector, MD  Patient coming from: Home  Chief Complaint:  Chief Complaint  Patient presents with   Fall   Altered Mental Status   HPI: Noah Boone is a 89 y.o. male with medical history significant of GERD, hypertension hyperlipidemia, stroke presenting with fall and CVA.  Patient reports worsening weakness over the past 4 to 5 days.  Also with slurred speech during this timeframe.  No remote history of CVA approximately 10 years ago.  Patient also noted to have had another CVA event in November of last year to which patient refused admission.  Patient was placed on aspirin and Plavix at that time with recommendation for outpatient neurology follow-up.  In review of the chart, the patient had follow-up with neurology at [facility] clinic with recommendation for cardiovascular imaging and cardiology referral.  Patient appears to not have had evaluation.  Patient does report taking aspirin and Plavix daily.  Lives at home alone.  Does have chronic weakness.  No fevers or chills.  No nausea or vomiting.  No chest pain or shortness of breath.  No new or worsening weakness with fall.  No reported head trauma or loss of consciousness.  Noted worsening slurred speech. Presented to the ER afebrile, hemodynamically stable.  White count 10.9, hemoglobin 12.5, platelets 226, urinalysis not indicative of infection.  Creatinine 1.4.  CT head with interval lacunar infarct at the right internal capsule.  MRI of the brain with acute small vessel infarct of the right internal capsule.  L-spine plain films grossly stable.  Status post repair of right elbow laceration. Review of Systems: As mentioned in the history of present illness. All other systems reviewed and are negative. Past Medical History:  Diagnosis Date   Arthritis    GERD  (gastroesophageal reflux disease)    Hypercholesteremia    Hypertension    Stroke [facility])    Past Surgical History:  Procedure Laterality Date   CATARACT EXTRACTION W/PHACO Left 08/19/2016   Procedure: CATARACT EXTRACTION PHACO AND INTRAOCULAR LENS PLACEMENT ([facility]);  Surgeon: Birder Robson, MD;  Location: [facility] ORS;  Service: Ophthalmology;  Laterality: Left;  [place] 00:59 AP% 15.5 CDE 9.23 fluid pack lot # [id number]   CATARACT EXTRACTION W/PHACO Right 09/09/2016   Procedure: CATARACT EXTRACTION PHACO AND INTRAOCULAR LENS PLACEMENT ([facility]);  Surgeon: Birder Robson, MD;  Location: [facility] ORS;  Service: Ophthalmology;  Laterality: Right;  [place] 00:39 AP% 16.3 CDE 6.36 Fluid pack lot 3 [id number]   COLONOSCOPY     EYE SURGERY     TONSILLECTOMY     Social History:  reports that he quit smoking about 51 years ago. He has quit using smokeless tobacco. He reports current alcohol use. He reports that he does not use drugs.  No Known Allergies  History reviewed. No pertinent family history.  Prior to Admission medications   Medication Sig Start Date End Date Taking? Authorizing Provider  amLODipine (NORVASC) 5 MG tablet Take 5 mg by mouth at bedtime.    Yes [provider]  aspirin EC 81 MG tablet Take 81 mg by mouth daily. Swallow whole.   Yes [provider]  clopidogrel (PLAVIX) 75 MG tablet Take 75 mg by mouth daily.   Yes [provider]  levothyroxine (SYNTHROID) 50 MCG tablet Take 50 mcg by mouth daily.   Yes [provider]  lovastatin (MEVACOR) 40 MG tablet Take 40 mg by mouth at bedtime.   Yes [provider]  pantoprazole (PROTONIX) 40 MG tablet Take 40 mg by mouth daily.   Yes [provider]  Difluprednate (DUREZOL) 0.05 % EMUL Place 1 drop into the right eye 2 (two) times daily. Patient not taking: Reported on 12/04/2021    [provider]  ILEVRO 0.3 % ophthalmic suspension Place 1 drop into the right eye daily. Patient not  taking: Reported on 12/04/2021 08/11/16   [provider]    Physical Exam: Vitals:   03/31/22 0421 03/31/22 0730 03/31/22 0736 03/31/22 0800  BP:   (!) 141/70 (!) 129/59  Pulse:  80  (!) 59  Resp:  (!) 23  15  Temp:      TempSrc:      SpO2:  100%  97%  Weight: 68 kg     Height: 5\' 8"  (1.727 m)      Physical Exam Constitutional:      General: He is not in acute distress.    Appearance: He is normal weight.  HENT:     Head: Normocephalic and atraumatic.     Nose: Nose normal.     Mouth/Throat:     Mouth: Mucous membranes are moist.  Eyes:     Extraocular Movements: Extraocular movements intact.     Pupils: Pupils are equal, round, and reactive to light.  Cardiovascular:     Rate and Rhythm: Normal rate and regular rhythm.  Pulmonary:     Effort: Pulmonary effort is normal.  Abdominal:     General: Bowel sounds are normal.  Musculoskeletal:     Cervical back: Normal range of motion.     Comments: Mild generalized weakness  Skin:    General: Skin is warm.  Neurological:     Comments: Positive slurred speech and generalized weakness otherwise grossly nonfocal neuroexam  Psychiatric:        Mood and Affect: Mood normal.     Data Reviewed:  There are no new results to review at this time. DG Lumbar Spine Complete CLINICAL DATA:  Fall from bed  EXAM: [place] 4 VIEW  COMPARISON:  None Available.  FINDINGS: Generalized disc space narrowing with mild endplate and facet spurring. No evidence of fracture or bone lesion. Slight dextrocurvature. Subjective osteopenia.  IMPRESSION: No acute or focal finding.  Electronically Signed   By: Jorje Guild M.D.   On: 03/31/2022 07:10 MR BRAIN WO CONTRAST CLINICAL DATA:  Neuro deficit with acute stroke suspected  EXAM: MRI HEAD WITHOUT CONTRAST  TECHNIQUE: Multiplanar, multiecho pulse sequences of the brain and surrounding structures were obtained without intravenous  contrast.  COMPARISON:  12/04/2021  FINDINGS: Brain: Acute small vessel infarct at the right internal capsule. Chronic infarct in the same location on the left. Generalized cerebral volume loss with ventriculomegaly. No acute hemorrhage, obstructive hydrocephalus, mass, or collection. Ischemic gliosis in the cerebral white matter is mild for age.  Vascular: Major flow voids are preserved  Skull and upper cervical spine: Cervical facet spurring asymmetric to the right where covered. No focal lesion.  Sinuses/Orbits: Negative  Other: Intermittent motion artifact. Fast brain protocol intermittently use.  IMPRESSION: Acute small vessel infarct in the right internal capsule.  Electronically Signed   By: Jorje Guild M.D.   On: 03/31/2022 06:40 CT HEAD WO CONTRAST CLINICAL DATA:  Slurred speech.  EXAM: CT HEAD WITHOUT CONTRAST  TECHNIQUE: Contiguous axial images were obtained from the  base of the skull through the vertex without intravenous contrast.  RADIATION DOSE REDUCTION: This exam was performed according to the departmental dose-optimization program which includes automated exposure control, adjustment of the mA and/or kV according to patient size and/or use of iterative reconstruction technique.  COMPARISON:  12/04/2021 brain MRI  FINDINGS: Brain: Low-density at the posterior limb right internal capsule since prior, fairly well-defined but still possibly acute. Chronic perforator infarct at the left internal capsule known from prior imaging. No evidence of cortical infarct. No hemorrhage, hydrocephalus, or collection.  Vascular: No hyperdense vessel or unexpected calcification.  Skull: Normal. Negative for fracture or focal lesion.  Sinuses/Orbits: No acute finding.  IMPRESSION: Interval lacunar infarct at the right internal capsule since 12/04/2021, possibly acute in this setting.  Electronically Signed   By: Jorje Guild M.D.   On: 03/31/2022  04:46  Lab Results  Component Value Date   WBC 10.9 (H) 03/31/2022   HGB 12.5 (L) 03/31/2022   HCT 38.9 (L) 03/31/2022   MCV 95.6 03/31/2022   PLT 226 [id number]   Last metabolic panel Lab Results  Component Value Date   GLUCOSE 116 (H) 03/31/2022   NA 140 03/31/2022   K 4.4 03/31/2022   CL 100 03/31/2022   CO2 27 03/31/2022   BUN 25 (H) 03/31/2022   CREATININE 1.39 (H) 03/31/2022   GFRNONAA 48 (L) 03/31/2022   CALCIUM 9.7 03/31/2022   PROT 7.4 03/31/2022   ALBUMIN 3.8 03/31/2022   BILITOT 0.8 03/31/2022   ALKPHOS 87 03/31/2022   AST 19 03/31/2022   ALT 14 03/31/2022   ANIONGAP 13 03/31/2022    Assessment and Plan: * Acute CVA (cerebrovascular accident) ([place]) New onset slurred speech as well as worsening generalized weakness x 5 days with subsequent fall Noted new small right internal capsule infarct on MRI On aspirin Plavix in the setting of prior CVAs per report Will plan for formal stroke evaluation including MRA of the head and neck, 2D echo, risk stratification labs Continue aspirin Plavix Start statin   Neurology formally consult Follow-up and formal neurology recommendations Follow  HTN (hypertension) BP stable Allow for permissive hypertension in the setting of acute CVA As needed labetalol for systolic blood pressure greater than [id number] or diastolic blood pressure greater than 110  AKI (acute kidney injury) ([place]) Cr 1.4 on presentation  Pt mildly dry  Will place on gentle IVF  Check FeNa  Renal imaging as appropriate  Hold nephrotoxic agents    GERD (gastroesophageal reflux disease) Cont protonix    Hypothyroidism Cont home synthroid    Fall at home, initial encounter Positive follow home in setting of acute CVA with noted right elbow skin tear Fall precautions PT/OT evaluation         Advance Care Planning:   Code Status: Not on file   Consults: neurology- Dr. Cheral Marker   Family Communication: No family at the bedside   Severity of  Illness: The appropriate patient status for this patient is OBSERVATION. Observation status is judged to be reasonable and necessary in order to provide the required intensity of service to ensure the patient's safety. The patient's presenting symptoms, physical exam findings, and initial radiographic and laboratory data in the context of their medical condition is felt to place them at decreased risk for further clinical deterioration. Furthermore, it is anticipated that the patient will be medically stable for discharge from the hospital within 2 midnights of admission.   Author: Deneise Lever, MD 03/31/2022 8:28 AM  For on call review [email or web address].

## 2022-03-31 NOTE — Assessment & Plan Note (Signed)
Cr 1.4 on presentation  Pt mildly dry  Will place on gentle IVF  Check FeNa  Renal imaging as appropriate  Hold nephrotoxic agents

## 2022-04-01 DIAGNOSIS — R471 Dysarthria and anarthria: Secondary | ICD-10-CM

## 2022-04-01 DIAGNOSIS — W19XXXA Unspecified fall, initial encounter: Secondary | ICD-10-CM | POA: Diagnosis not present

## 2022-04-01 DIAGNOSIS — I639 Cerebral infarction, unspecified: Secondary | ICD-10-CM | POA: Diagnosis not present

## 2022-04-01 LAB — COMPREHENSIVE METABOLIC PANEL
ALT: 13 U/L (ref 0–44)
AST: 18 U/L (ref 15–41)
Albumin: 3.2 g/dL — ABNORMAL LOW (ref 3.5–5.0)
Alkaline Phosphatase: 75 U/L (ref 38–126)
Anion gap: 9 (ref 5–15)
BUN: 23 mg/dL (ref 8–23)
CO2: 25 mmol/L (ref 22–32)
Calcium: 8.9 mg/dL (ref 8.9–10.3)
Chloride: 103 mmol/L (ref 98–111)
Creatinine, Ser: 1.29 mg/dL — ABNORMAL HIGH (ref 0.61–1.24)
GFR, Estimated: 52 mL/min — ABNORMAL LOW (ref >60)
Glucose, Bld: 97 mg/dL (ref 70–99)
Potassium: 3.8 mmol/L (ref 3.5–5.1)
Sodium: 137 mmol/L (ref 135–145)
Total Bilirubin: 0.7 mg/dL (ref 0.3–1.2)
Total Protein: 6.6 g/dL (ref 6.5–8.1)

## 2022-04-01 LAB — LIPID PANEL
Cholesterol: 98 mg/dL (ref 0–200)
HDL: 55 mg/dL (ref >40)
LDL Cholesterol: 32 mg/dL (ref 0–99)
Total CHOL/HDL Ratio: 1.8 RATIO
Triglycerides: 54 mg/dL (ref <150)
VLDL: 11 mg/dL (ref 0–40)

## 2022-04-01 LAB — CBC
HCT: 37.6 % — ABNORMAL LOW (ref 39.0–52.0)
Hemoglobin: 12.3 g/dL — ABNORMAL LOW (ref 13.0–17.0)
MCH: 30.8 pg (ref 26.0–34.0)
MCHC: 32.7 g/dL (ref 30.0–36.0)
MCV: 94.2 fL (ref 80.0–100.0)
Platelets: 225 10*3/uL (ref 150–400)
RBC: 3.99 MIL/uL — ABNORMAL LOW (ref 4.22–5.81)
RDW: 13.2 % (ref 11.5–15.5)
WBC: 6.8 10*3/uL (ref 4.0–10.5)
nRBC: 0 % (ref 0.0–0.2)

## 2022-04-01 NOTE — Progress Notes (Signed)
Occupational Therapy Treatment Patient Details Name: Noah Boone MRN: [id number] DOB: December 26, 1929 Today's Date: 04/01/2022   History of present illness Pt is a 89 y/o M admitted on 03/31/22 after presenting with c/o worsening weakness over the past 4-5 days, as well as slurred speech. 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 PMH: GERD, HTN, HLD, stroke   OT comments  Noah Boone was seen for OT treatment on this date. Upon arrival to room pt reclined in bed, RN at bed side, agreeable to tx. Pt currently MOD I don B socks seated EOB. SUPERVISION don gown standing. SBA functional mobility ~100 ft no AD use, improves to SUPERVISION + RW for ~50 ft. Educated on falls prevention strategies and aspiration pcns. Pt making good progress toward goals, will continue to follow POC. Discharge recommendation remains appropriate.     Recommendations for follow up therapy are one component of a multi-disciplinary discharge planning process, led by the attending physician.  Recommendations may be updated based on patient status, additional functional criteria and insurance authorization.    Follow Up Recommendations  Home health OT     Assistance Recommended at Discharge Intermittent Supervision/Assistance  Patient can return home with the following  Help with stairs or ramp for entrance;Assistance with cooking/housework   Equipment Recommendations  None recommended by OT    Recommendations for Other Services      Precautions / Restrictions Precautions Precautions: Fall Restrictions Weight Bearing Restrictions: No       Mobility Bed Mobility Overal bed mobility: Modified Independent                  Transfers Overall transfer level: Needs assistance Equipment used: None Transfers: Sit to/from Stand Sit to Stand: Supervision                 Balance Overall balance assessment: Needs assistance Sitting-balance support: Feet supported Sitting balance-Leahy Scale:  Normal     Standing balance support: During functional activity, No upper extremity supported Standing balance-Leahy Scale: Good                             ADL either performed or assessed with clinical judgement   ADL Overall ADL's : Needs assistance/impaired                                       General ADL Comments: MOD I don B socks seated EOB. SUPERVISION don gown standing. SBA functional mobility ~100 ft no AD use, improves to SUPERVISION + RW for ~50 ft      Cognition Arousal/Alertness: Awake/alert Behavior During Therapy: WFL for tasks assessed/performed Overall Cognitive Status: Within Functional Limits for tasks assessed                                                     Pertinent Vitals/ Pain       Pain Assessment Pain Assessment: No/denies pain   Frequency  Min 3X/week        Progress Toward Goals  OT Goals(current goals can now be found in the care plan section)  Progress towards OT goals: Progressing toward goals  Acute Rehab OT Goals Patient Stated Goal: to go home OT Goal  Formulation: With patient Time For Goal Achievement: 04/14/22 Potential to Achieve Goals: Good ADL Goals Pt Will Perform Grooming: Independently;standing Pt Will Perform Lower Body Dressing: sit to/from stand;Independently Pt Will Transfer to Toilet: ambulating;regular height toilet;with modified independence  Plan Discharge plan remains appropriate;Frequency remains appropriate    Co-evaluation                 AM-PAC OT "6 Clicks" Daily Activity     Outcome Measure   Help from another person eating meals?: None Help from another person taking care of personal grooming?: A Little Help from another person toileting, which includes using toliet, bedpan, or urinal?: A Little Help from another person bathing (including washing, rinsing, drying)?: A Little Help from another person to put on and taking off regular upper  body clothing?: None Help from another person to put on and taking off regular lower body clothing?: A Little 6 Click Score: 20    End of Session Equipment Utilized During Treatment: Rolling walker (2 wheels)  OT Visit Diagnosis: Other abnormalities of gait and mobility (R26.89);Muscle weakness (generalized) (M62.81)   Activity Tolerance Patient tolerated treatment well   Patient Left in chair;with call bell/phone within reach;with chair alarm set   Nurse Communication          Time: 1009-1030 OT Time Calculation (min): 21 min  Charges: OT General Charges $OT Visit: 1 Visit OT Treatments $Self Care/Home Management : 8-22 mins  Dessie Coma, M.S. OTR/L  04/01/22, 11:13 AM  ascom [phone]

## 2022-04-01 NOTE — Progress Notes (Signed)
Pt seen briefly prior to d/c to assess dynamic standing balance and safe mobility. Pt appears cautious while ambulating in hall without AD, slow cadence, however no LOB with changes in direction or head turns. Appears at functional baseline. Will benefit from [facility] services.   04/01/22 1200  PT Visit Information  Assistance Needed +1  History of Present Illness Pt is a 89 y/o M admitted on 03/31/22 after presenting with c/o worsening weakness over the past 4-5 days, as well as slurred speech. 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 PMH: GERD, HTN, HLD, stroke  Subjective Data  Subjective I'm ready to go home today  Patient Stated Goal return home  Precautions  Precautions Fall  Restrictions  Weight Bearing Restrictions No  Pain Assessment  Pain Assessment No/denies pain  Cognition  Arousal/Alertness Awake/alert  Behavior During Therapy WFL for tasks assessed/performed  Overall Cognitive Status Within Functional Limits for tasks assessed  Bed Mobility  Overal bed mobility Modified Independent  Transfers  Overall transfer level Needs assistance  Equipment used None  Transfers Sit to/from Stand  Sit to Stand Supervision  General transfer comment  (Increased time and 2 attempts to raise from low recliner)  Ambulation/Gait  Ambulation/Gait assistance Supervision;Min guard  Gait Distance (Feet)  (60)  Assistive device None  Gait Pattern/deviations Decreased step length - left;Decreased step length - right;Decreased stride length;Decreased dorsiflexion - left  General Gait Details Decreased heel strike LLE, decreased hip/knee flexion LLE during swing phase, decreased step length LLE.  Gait velocity decreased  Balance  Overall balance assessment Needs assistance  Sitting-balance support Feet supported  Sitting balance-Leahy Scale Normal  Standing balance support During functional activity;No upper extremity supported  Standing balance-Leahy Scale Good  General Comments   General comments (skin integrity, edema, etc.) Right elbow skin tear, dressing intact  PT - End of Session  Equipment Utilized During Treatment Gait belt  Activity Tolerance Patient tolerated treatment well  Patient left in chair;with call bell/phone within reach  Nurse Communication Mobility status   PT - Assessment/Plan  PT Plan Current plan remains appropriate  PT Visit Diagnosis Unsteadiness on feet (R26.81);Muscle weakness (generalized) (M62.81);Hemiplegia and hemiparesis  Hemiplegia - Right/Left Left  Hemiplegia - dominant/non-dominant Non-dominant  Hemiplegia - caused by Cerebral infarction  PT Frequency (ACUTE ONLY) 7X/week  Follow Up Recommendations Home health PT  Can patient physically be transported by private vehicle Yes  Assistance recommended at discharge Intermittent Supervision/Assistance  Patient can return home with the following A little help with walking and/or transfers;Assistance with cooking/housework;A little help with bathing/dressing/bathroom;Assist for transportation;Help with stairs or ramp for entrance  PT equipment None recommended by PT  AM-PAC PT "6 Clicks" Mobility Outcome Measure (Version 2)  Help needed turning from your back to your side while in a flat bed without using bedrails? 4  Help needed moving from lying on your back to sitting on the side of a flat bed without using bedrails? 3  Help needed moving to and from a bed to a chair (including a wheelchair)? 3  Help needed standing up from a chair using your arms (e.g., wheelchair or bedside chair)? 3  Help needed to walk in hospital room? 3  Help needed climbing 3-5 steps with a railing?  3  6 Click Score 19  Consider Recommendation of Discharge To: Home with [facility]  Progressive Mobility  What is the highest level of mobility based on the progressive mobility assessment? Level 5 (Walks with assist in room/hall) - Balance while stepping  forward/back and can walk in room with assist - Complete   Activity Ambulated with assistance in hallway  PT Goal Progression  Progress towards PT goals Progressing toward goals  PT Time Calculation  PT Start Time (ACUTE ONLY) 1120  PT Stop Time (ACUTE ONLY) 1136  PT Time Calculation (min) (ACUTE ONLY) 16 min  PT General Charges  $$ ACUTE PT VISIT 1 Visit  PT Treatments  $Gait Training 8-22 mins   Mikel Cella, PTA 04/01/2022

## 2022-04-01 NOTE — Progress Notes (Signed)
DC instructions reviewed with patient, SO Jacqlyn Larsen, [place] Dominica Severin who verbalized understanding.  Written info provided [id number], fall risk, SLP recommendations. Dressing changed to right upper arm abrasion.  Instructed to call PCP to schedule a f/u appt.  All belonging returned.  Tele and IV removed as documented.  Discharged via [facility] without incident

## 2022-04-01 NOTE — Progress Notes (Addendum)
Speech Language Pathology Treatment: Dysphagia  Patient Details Name: Noah Boone MRN: [id number] DOB: [birth year] Today's Date: 04/01/2022 Time: [id number] SLP Time Calculation (min) (ACUTE ONLY): 30 min  Assessment / Plan / Recommendation Clinical Impression  Pt seen today for dysphagia tx. Pt walking to his chair w/ OT present upon ST arrival. OT reported pt attempted drinking thin liquid w/ NSG w/ meds and coughed. Pt alert and cooperative t/o session. NSG arrived during session w/ updates regarding pt's d/c. Pt left sitting up in chair w/ call button in reach and bed alarm set. Of note: Pt intermittently clearing throat at baseline prior to po's -- suspect own saliva?  Pt on RA; afebrile; WBC WNL.  Pt's oropharyngeal swallow appears grossly FUNCTIONAL for pt -- unsure of his true baseline though pt has stated it seems "worse this time." Pt reported drooling since last CVA in 10/2021. Noted intermittent throat clearing prior to and b/t po's w/ min increase w/ trials of po's; however, no coughing nor increase SOB/WOB noted.   Administered trials of ice chips (x4) and thin liquids (x15+ sips) via cup following strict aspiration precautions as educated on. Pt fed self. ST provided min/mod verbal cues t/o session to take SMALL sips, drink SLOWLY, and take rest breaks b/t sips. During oral phase, pt exhibited min anterior labial spillage x1. Pt w/ min oral residue/saliva post-swallow. Pt continually educated on using lingual sweep to clear oral cavity after swallowing and exhibited understanding of this technique c/b consistently using it b/t trials. Otherwise, pt exhibited adequate bolus control/management and timely A-P bolus transit. During pharyngeal phase, pt demonstrated intermittent throat clearing b/t trials and wet vocal quality x2, which improved given cues to clear throat. Pt encouraged to clear throat during trials to protect air way. No coughing or increased SOB/WOB noted.  Pt  educated at length on aspiration precautions and compensatory strategies (SMALL sips, SLOW sips, rest break b/t sips, effortful swallow, frequent lingual sweep, NO straw). Pt demonstrated understanding of these strategies by applying them during trials and accurately participating in teach back of strategies. Pt provided handouts outlining compensatory strategies and instructed to give to caregiver. RN updated. RN/Pt agreed.  Recommend Dys 3 diet w/ thin liquids via CUP. Recommend Strict aspiration precautions at all po's (small sips, slow sips, rest breaks b/t sips, sitting upright) and use of compensatory strategies (effortful swallow, lingual sweep after bites/sips, intermittent throat clearing). Recommend NO straws. Recommend pills whole w/ puree. Pt to feed self w/ assistance for set up as needed. Recommend intermittent supervision during meals. ST services contacted MD regarding pt's swallowing and the above presentation/recommendations. MD aware of pt's pharyngeal phase swallowing presentation and will f/u in discussion w/ pt. Recommend careful monitoring of pulmonary status now and at d/c for any decline from potential impact of negative sequela from aspiration. Recommend pt f/u w/ PCP for any further needs as MD is planning to d/c pt home today.      HPI HPI: Pt is a 89 y.o. male with medical history significant of GERD, hypertension hyperlipidemia, stroke ~1mos and ~10 years ago who presented with fall and new CVA.  Patient reports worsening weakness over the past 4 to 5 days.  Also with slurred speech during this timeframe.  No remote history of CVA approximately 10 years ago.  Patient also noted to have had another CVA event in November of last year to which patient refused admission.  Patient was placed on aspirin and Plavix at that time with recommendation for  outpatient neurology follow-up.  In review of the chart, the patient had follow-up with neurology at [facility] clinic with recommendation  for cardiovascular imaging and cardiology referral.  Patient appears to not have had evaluation.  Patient does report taking aspirin and Plavix daily.  Lives at home alone.  Does have chronic weakness.  MRI:  Acute small vessel infarct at the right internal capsule.  Chronic infarct in the same location on the left. Generalized  cerebral volume loss with ventriculomegaly. No acute hemorrhage.      SLP Plan  Continue with current plan of care      Recommendations for follow up therapy are one component of a multi-disciplinary discharge planning process, led by the attending physician.  Recommendations may be updated based on patient status, additional functional criteria and insurance authorization.    Recommendations  Diet recommendations: Thin liquid;Dysphagia 3 (mechanical soft) Liquids provided via: Cup;No straw Medication Administration: Whole meds with puree Supervision: Patient able to self feed;Intermittent supervision to cue for compensatory strategies Compensations: Minimize environmental distractions;Small sips/bites;Slow rate;Lingual sweep for clearance of pocketing;Multiple dry swallows after each bite/sip;Clear throat intermittently;Effortful swallow Postural Changes and/or Swallow Maneuvers: Out of bed for meals;Seated upright 90 degrees;Upright 30-60 min after meal                Oral Care Recommendations: Oral care BID;Oral care before and after PO;Patient independent with oral care Follow Up Recommendations: Follow physician's recommendations for discharge plan and follow up therapies Assistance recommended at discharge: Intermittent Supervision/Assistance SLP Visit Diagnosis: Dysphagia, unspecified (R13.10);Dysarthria and anarthria (R47.1) Plan: Continue with current plan of care         [place], Speech Pathology   Randall Hiss  04/01/2022, 1:06 PM

## 2022-04-01 NOTE — TOC Progression Note (Signed)
Transition of Care [facility]) - Progression Note    Patient Details  Name: Noah Boone MRN: [id number] Date of Birth: 15-May-1929  [facility]) CM/SW Contact  Laurena Slimmer, RN Phone Number: 04/01/2022, 10:19 AM  Clinical Narrative:    Spoke with patient regarding discharge home. He is argreeable to [facility]. He does not have a preference of a home health agency or  palliative care. He states his friend Dominica Severin will pick him up today.   Referral sent to [facility] at [facility] Referral for palliative sent to [facility] from [facility].   TOC signing off.         Expected Discharge Plan and Services         Expected Discharge Date: 04/01/22                                     Social Determinants of Health (SDOH) Interventions SDOH Screenings   Food Insecurity: No Food Insecurity (03/31/2022)  Housing: Low Risk  (03/31/2022)  Transportation Needs: No Transportation Needs (03/31/2022)  Utilities: Not At Risk (03/31/2022)  Tobacco Use: Medium Risk (03/31/2022)    Readmission Risk Interventions     No data to display

## 2022-04-02 NOTE — Discharge Summary (Signed)
Physician Discharge Summary   Patient: Noah Boone MRN: [id number] DOB: 05/06/1929  Admit date:     03/31/2022  Discharge date: 04/01/2022  Discharge Physician: Max Sane   PCP: Adin Hector, MD   Recommendations at discharge:    F/up with outpt providers as requested  Discharge Diagnoses: Principal Problem:   Acute CVA (cerebrovascular accident) [name]) Active Problems:   Fall at home, initial encounter   Hypothyroidism   GERD (gastroesophageal reflux disease)   AKI (acute kidney injury) ([name])   HTN (hypertension)  Hospital Course: Assessment and Plan: * Acute CVA (cerebrovascular accident) ([facility]) New onset slurred speech as well as worsening generalized weakness x 5 days with subsequent fall Noted new small right internal capsule infarct on MRI Exam revealed prominent dysarthria, without aphasia. - CT head: Interval development of a lacunar infarct at the right internal capsule since 12/04/2021, possibly acute in this setting. - MRI brain: Acute small vessel infarct at the right internal capsule. Chronic infarct in the same location on the left. Generalized cerebral volume loss with ventriculomegaly. Ischemic gliosis in the cerebral white matter is mild for age. - MRA of head and neck: No large vessel occlusion or hemodynamically significant stenosis in the head or neck. Tortuous cervical ICAs bilaterally. - TTE: LVEF 55 to 60%. The left ventricle has normal function with no regional  wall motion abnormalities. Left ventricular diastolic parameters are consistent with Grade I diastolic dysfunction (impaired relaxation). No mural thrombus or valvular vegetation mentioned in the report.  - EKG: NSR, left axis deviation, LBBB.  - Neuro seen and recommends to continue ASA, Plavix & lovastatin - Unable to add new stroke prevention medications as he is already on maximal medical therapy.  - PT/OT recommends HHPT, OT which was set up at D/C - Outpatient Neurology follow up.   - ST educated patient at length on aspiration precautions and compensatory strategies (SMALL sips, SLOW sips, rest break b/t sips, effortful swallow, frequent lingual sweep, NO straw). Pt demonstrated understanding of these strategies by applying them during trials and accurately participating in teach back of strategies. Pt provided handouts outlining compensatory strategies and instructed to give to caregiver.   Recommend Dys 3 diet w/ thin liquids via CUP. Recommend Strict aspiration precautions at all po's (small sips, slow sips, rest breaks b/t sips, sitting upright) and use of compensatory strategies (effortful swallow, lingual sweep after bites/sips, intermittent throat clearing). Recommend NO straws. Recommend pills whole w/ puree. Pt to feed self w/ assistance for set up as needed. Recommend intermittent supervision during meals.  HTN (hypertension) BP stable  AKI (acute kidney injury) ([name]) Improved with hydration  GERD (gastroesophageal reflux disease) Cont protonix   Hypothyroidism Cont home synthroid   Fall at home, initial encounter Positive follow home in setting of acute CVA with noted right elbow skin tear Fall precautions PT/OT recommends HHPT, OT which are in place at D/C        Consultants: Neuro, PT, OT, ST Disposition: Home health Diet recommendation:  Discharge Diet Orders (From admission, onward)     Start     Ordered   04/01/22 0000  Diet - low sodium heart healthy        04/01/22 1000           Carb modified diet DISCHARGE MEDICATION: Allergies as of 04/01/2022   No Known Allergies      Medication List     STOP taking these medications    Durezol 0.05 % Emul Generic  drug: Difluprednate   Ilevro 0.3 % ophthalmic suspension Generic drug: nepafenac       TAKE these medications    amLODipine 5 MG tablet Commonly known as: NORVASC Take 5 mg by mouth at bedtime.   aspirin EC 81 MG tablet Take 81 mg by mouth daily. Swallow  whole.   clopidogrel 75 MG tablet Commonly known as: PLAVIX Take 75 mg by mouth daily.   levothyroxine 50 MCG tablet Commonly known as: SYNTHROID Take 50 mcg by mouth daily.   lovastatin 40 MG tablet Commonly known as: MEVACOR Take 40 mg by mouth at bedtime.   pantoprazole 40 MG tablet Commonly known as: PROTONIX Take 40 mg by mouth daily.        Discharge Exam: Filed Weights   03/31/22 0421 04/01/22 0453  Weight: 68 kg 64.5 kg   Constitutional:      General: He is not in acute distress.    Appearance: He is normal weight.  HENT:     Head: Normocephalic and atraumatic.     Nose: Nose normal.     Mouth/Throat:     Mouth: Mucous membranes are moist.  Eyes:     Extraocular Movements: Extraocular movements intact.     Pupils: Pupils are equal, round, and reactive to light.  Cardiovascular:     Rate and Rhythm: Normal rate and regular rhythm.  Pulmonary:     Effort: Pulmonary effort is normal.  Abdominal:     General: Bowel sounds are normal.  Musculoskeletal:     Cervical back: Normal range of motion.     Comments: Mild generalized weakness  Skin:    General: Skin is warm.  Neurological:     Comments: Positive slurred speech and generalized weakness otherwise grossly nonfocal neuroexam  Psychiatric:        Mood and Affect: Mood normal.     Condition at discharge: fair  The results of significant diagnostics from this hospitalization (including imaging, microbiology, ancillary and laboratory) are listed below for reference.   Imaging Studies: ECHOCARDIOGRAM COMPLETE  Result Date: 03/31/2022    ECHOCARDIOGRAM REPORT   Patient Name:   Noah Boone Date of Exam: 03/31/2022 Medical Rec #:  [id number]          Height:       68.0 in Accession #:    [id number]         Weight:       149.9 lb Date of Birth:  1929-12-10           BSA:          1.808 m Patient Age:    89 years           BP:           129/59 mmHg Patient Gender: M                  HR:           59 bpm.  Exam Location:  [facility] Procedure: 2D Echo, Cardiac Doppler and Color Doppler Indications:     TIA G45.9  History:         Patient has no prior history of Echocardiogram examinations.                  Stroke; Risk Factors:Hypertension.  Sonographer:     Sherrie Sport Referring Phys:  Celebr[place] Diagnosing Phys: Kathlyn Sacramento MD  Sonographer Comments: Technically challenging study due to limited acoustic windows and no  parasternal window. Image acquisition challenging due to patient body habitus. IMPRESSIONS  1. Left ventricular ejection fraction, by estimation, is 55 to 60%. The left ventricle has normal function. The left ventricle has no regional wall motion abnormalities. Left ventricular diastolic parameters are consistent with Grade I diastolic dysfunction (impaired relaxation).  2. Right ventricular systolic function is normal. The right ventricular size is normal. There is normal pulmonary artery systolic pressure.  3. The mitral valve is normal in structure. Mild mitral valve regurgitation. No evidence of mitral stenosis.  4. The aortic valve is normal in structure. Aortic valve regurgitation is mild. No aortic stenosis is present. FINDINGS  Left Ventricle: Left ventricular ejection fraction, by estimation, is 55 to 60%. The left ventricle has normal function. The left ventricle has no regional wall motion abnormalities. The left ventricular internal cavity size was normal in size. There is  no left ventricular hypertrophy. Abnormal (paradoxical) septal motion, consistent with left bundle branch block. Left ventricular diastolic parameters are consistent with Grade I diastolic dysfunction (impaired relaxation). Right Ventricle: The right ventricular size is normal. No increase in right ventricular wall thickness. Right ventricular systolic function is normal. There is normal pulmonary artery systolic pressure. The tricuspid regurgitant velocity is 2.71 m/s, and  with an assumed right atrial pressure of  5 mmHg, the estimated right ventricular systolic pressure is 05.6 mmHg. Left Atrium: Left atrial size was normal in size. Right Atrium: Right atrial size was normal in size. Pericardium: There is no evidence of pericardial effusion. Mitral Valve: The mitral valve is normal in structure. Mild mitral valve regurgitation. No evidence of mitral valve stenosis. MV peak gradient, 6.0 mmHg. The mean mitral valve gradient is 2.0 mmHg. Tricuspid Valve: The tricuspid valve is normal in structure. Tricuspid valve regurgitation is mild . No evidence of tricuspid stenosis. Aortic Valve: The aortic valve is normal in structure. Aortic valve regurgitation is mild. No aortic stenosis is present. Aortic valve mean gradient measures 4.0 mmHg. Aortic valve peak gradient measures 7.1 mmHg. Aortic valve area, by VTI measures 2.25 cm. Pulmonic Valve: The pulmonic valve was normal in structure. Pulmonic valve regurgitation is not visualized. No evidence of pulmonic stenosis. Aorta: The aortic root is normal in size and structure. Venous: The inferior vena cava was not well visualized. IAS/Shunts: No atrial level shunt detected by color flow Doppler.  LEFT VENTRICLE PLAX 2D LVIDd:         4.30 cm   Diastology LVIDs:         2.90 cm   LV e' medial:    7.72 cm/s LV PW:         0.90 cm   LV E/e' medial:  9.0 LV IVS:        0.80 cm   LV e' lateral:   9.03 cm/s LVOT diam:     2.00 cm   LV E/e' lateral: 7.7 LV SV:         60 LV SV Index:   33 LVOT Area:     3.14 cm  RIGHT VENTRICLE RV Basal diam:  3.40 cm RV Mid diam:    3.10 cm RV S prime:     17.70 cm/s TAPSE (M-mode): 1.3 cm LEFT ATRIUM             Index        RIGHT ATRIUM          Index LA diam:        2.00 cm 1.11 cm/m   RA  Area:     9.04 cm LA Vol (A2C):   18.4 ml 10.18 ml/m  RA Volume:   14.60 ml 8.07 ml/m LA Vol (A4C):   12.2 ml 6.75 ml/m LA Biplane Vol: 14.6 ml 8.07 ml/m  AORTIC VALVE AV Area (Vmax):    2.12 cm AV Area (Vmean):   2.23 cm AV Area (VTI):     2.25 cm AV Vmax:            133.50 cm/s AV Vmean:          92.300 cm/s AV VTI:            0.266 m AV Peak Grad:      7.1 mmHg AV Mean Grad:      4.0 mmHg LVOT Vmax:         90.20 cm/s LVOT Vmean:        65.400 cm/s LVOT VTI:          0.190 m LVOT/AV VTI ratio: 0.72  AORTA Ao Root diam: 3.50 cm MITRAL VALVE                TRICUSPID VALVE MV Area (PHT): 3.12 cm     TR Peak grad:   29.4 mmHg MV Area VTI:   2.37 cm     TR Vmax:        271.00 cm/s MV Peak grad:  6.0 mmHg MV Mean grad:  2.0 mmHg     SHUNTS MV Vmax:       1.22 m/s     Systemic VTI:  0.19 m MV Vmean:      70.9 cm/s    Systemic Diam: 2.00 cm MV Decel Time: 243 msec MV E velocity: 69.20 cm/s MV A velocity: 107.00 cm/s MV E/A ratio:  0.65 Kathlyn Sacramento MD Electronically signed by Kathlyn Sacramento MD Signature Date/Time: 03/31/2022/12:12:48 PM    Final    MR ANGIO HEAD WO CONTRAST  Result Date: 03/31/2022 CLINICAL DATA:  Stroke/TIA.  Determine embolic source. EXAM: MRA NECK WITHOUT AND WITH CONTRAST MRA HEAD WITHOUT CONTRAST TECHNIQUE: Multiplanar and multiecho pulse sequences of the neck were obtained without and with intravenous contrast. Angiographic images of the neck were obtained using MRA technique without and with intravenous contrast; Angiographic images of the Circle of Willis were obtained using MRA technique without intravenous contrast. 3D post processing with multiplanar reformations and maximum intensity projections was performed for better evaluation of the cervical and cerebral vasculature. CONTRAST:  36mL GADAVIST GADOBUTROL 1 MMOL/ML IV SOLN COMPARISON:  MRI brain 03/31/2022. FINDINGS: MRA NECK FINDINGS Aortic arch: Incompletely imaged.  Proximal arch vessels are patent. Right carotid system: The common and internal carotid arteries are patent to the skull base without stenosis, aneurysm or dissection. Tortuous ICA. Left carotid system: The common and internal carotid arteries are patent to the skull base without stenosis, aneurysm or dissection. Tortuous  ICA. Vertebral arteries: Patent from the origin to the confluence with the basilar without stenosis or dissection. Other: None. MRA HEAD FINDINGS Anterior circulation: Intracranial ICAs are patent without stenosis or aneurysm. The proximal ACAs and MCAs are patent without stenosis or aneurysm. Distal branches are symmetric. Posterior circulation: Normal basilar artery. The SCAs, AICAs and PICAs are patent proximally. The PCAs are patent proximally without stenosis or aneurysm. Distal branches are symmetric. Anatomic variants: None. Other: None. IMPRESSION: 1. No large vessel occlusion or hemodynamically significant stenosis in the head or neck. 2. Tortuous cervical ICAs bilaterally. Electronically Signed   By: Emmit Alexanders  M.D.   On: 03/31/2022 10:09   MR ANGIO NECK W WO CONTRAST  Result Date: 03/31/2022 CLINICAL DATA:  Stroke/TIA.  Determine embolic source. EXAM: MRA NECK WITHOUT AND WITH CONTRAST MRA HEAD WITHOUT CONTRAST TECHNIQUE: Multiplanar and multiecho pulse sequences of the neck were obtained without and with intravenous contrast. Angiographic images of the neck were obtained using MRA technique without and with intravenous contrast; Angiographic images of the Circle of Willis were obtained using MRA technique without intravenous contrast. 3D post processing with multiplanar reformations and maximum intensity projections was performed for better evaluation of the cervical and cerebral vasculature. CONTRAST:  49mL GADAVIST GADOBUTROL 1 MMOL/ML IV SOLN COMPARISON:  MRI brain 03/31/2022. FINDINGS: MRA NECK FINDINGS Aortic arch: Incompletely imaged.  Proximal arch vessels are patent. Right carotid system: The common and internal carotid arteries are patent to the skull base without stenosis, aneurysm or dissection. Tortuous ICA. Left carotid system: The common and internal carotid arteries are patent to the skull base without stenosis, aneurysm or dissection. Tortuous ICA. Vertebral arteries: Patent from  the origin to the confluence with the basilar without stenosis or dissection. Other: None. MRA HEAD FINDINGS Anterior circulation: Intracranial ICAs are patent without stenosis or aneurysm. The proximal ACAs and MCAs are patent without stenosis or aneurysm. Distal branches are symmetric. Posterior circulation: Normal basilar artery. The SCAs, AICAs and PICAs are patent proximally. The PCAs are patent proximally without stenosis or aneurysm. Distal branches are symmetric. Anatomic variants: None. Other: None. IMPRESSION: 1. No large vessel occlusion or hemodynamically significant stenosis in the head or neck. 2. Tortuous cervical ICAs bilaterally. Electronically Signed   By: Emmit Alexanders M.D.   On: 03/31/2022 10:09   DG Lumbar Spine Complete  Result Date: 03/31/2022 CLINICAL DATA:  Fall from bed EXAM: [place] 4 VIEW COMPARISON:  None Available. FINDINGS: Generalized disc space narrowing with mild endplate and facet spurring. No evidence of fracture or bone lesion. Slight dextrocurvature. Subjective osteopenia. IMPRESSION: No acute or focal finding. Electronically Signed   By: Jorje Guild M.D.   On: 03/31/2022 07:10   MR BRAIN WO CONTRAST  Result Date: 03/31/2022 CLINICAL DATA:  Neuro deficit with acute stroke suspected EXAM: MRI HEAD WITHOUT CONTRAST TECHNIQUE: Multiplanar, multiecho pulse sequences of the brain and surrounding structures were obtained without intravenous contrast. COMPARISON:  12/04/2021 FINDINGS: Brain: Acute small vessel infarct at the right internal capsule. Chronic infarct in the same location on the left. Generalized cerebral volume loss with ventriculomegaly. No acute hemorrhage, obstructive hydrocephalus, mass, or collection. Ischemic gliosis in the cerebral white matter is mild for age. Vascular: Major flow voids are preserved Skull and upper cervical spine: Cervical facet spurring asymmetric to the right where covered. No focal lesion. Sinuses/Orbits: Negative  Other: Intermittent motion artifact. Fast brain protocol intermittently use. IMPRESSION: Acute small vessel infarct in the right internal capsule. Electronically Signed   By: Jorje Guild M.D.   On: 03/31/2022 06:40   CT HEAD WO CONTRAST  Result Date: 03/31/2022 CLINICAL DATA:  Slurred speech. EXAM: CT HEAD WITHOUT CONTRAST TECHNIQUE: Contiguous axial images were obtained from the base of the skull through the vertex without intravenous contrast. RADIATION DOSE REDUCTION: This exam was performed according to the departmental dose-optimization program which includes automated exposure control, adjustment of the mA and/or kV according to patient size and/or use of iterative reconstruction technique. COMPARISON:  12/04/2021 brain MRI FINDINGS: Brain: Low-density at the posterior limb right internal capsule since prior, fairly well-defined but still possibly acute. Chronic  perforator infarct at the left internal capsule known from prior imaging. No evidence of cortical infarct. No hemorrhage, hydrocephalus, or collection. Vascular: No hyperdense vessel or unexpected calcification. Skull: Normal. Negative for fracture or focal lesion. Sinuses/Orbits: No acute finding. IMPRESSION: Interval lacunar infarct at the right internal capsule since 12/04/2021, possibly acute in this setting. Electronically Signed   By: Jorje Guild M.D.   On: 03/31/2022 04:46    Microbiology: No results found for this or any previous visit.  Labs: CBC: Recent Labs  Lab 03/31/22 0425 04/01/22 0403  WBC 10.9* 6.8  NEUTROABS 6.6  --   HGB 12.5* 12.3*  HCT 38.9* 37.6*  MCV 95.6 94.2  PLT 226 [id number]   Basic Metabolic Panel: Recent Labs  Lab 03/31/22 0425 04/01/22 0403  NA 140 137  K 4.4 3.8  CL 100 103  CO2 27 25  GLUCOSE 116* 97  BUN 25* 23  CREATININE 1.39* 1.29*  CALCIUM 9.7 8.9   Liver Function Tests: Recent Labs  Lab 03/31/22 0425 04/01/22 0403  AST 19 18  ALT 14 13  ALKPHOS 87 75  BILITOT 0.8 0.7   PROT 7.4 6.6  ALBUMIN 3.8 3.2*   CBG: Recent Labs  Lab 03/31/22 0425  GLUCAP 107*    Discharge time spent: greater than 30 minutes.  Signed: Max Sane, MD [facility] 04/02/2022

## 2022-04-03 ENCOUNTER — Telehealth: Payer: Self-pay

## 2022-04-03 ENCOUNTER — Other Ambulatory Visit: Payer: Medicare PPO

## 2022-04-03 DIAGNOSIS — Z515 Encounter for palliative care: Secondary | ICD-10-CM

## 2022-04-03 NOTE — Progress Notes (Signed)
PATIENT NAME: Noah Boone DOB: 23-Nov-1929 MRN: [id number]  PRIMARY CARE PROVIDER: Adin Hector, MD  RESPONSIBLE PARTY:  Acct ID - Guarantor Home Phone Work Phone Relationship Acct Type  [id number] LEDELL, [id number]  Self P/F     [address] [place], [place] [address]   I connected with  Yong Channel on 04/03/22 by telephone and verified that I am speaking with the correct person using two identifiers.   I discussed the limitations of evaluation and management by telemedicine. The patient expressed understanding and agreed to proceed.   ACP:  Patient reports a living will is already in place.  Discussed code status and he desires DNR.  Will get DNR to patient tomorrow on scheduled visit.  Patient would like to remain in his home for as long as possible.  Will discuss goals of care in person as it is difficult to understand patient over the phone.   Functional Status:  Patient has 24/7 caregivers in place.  He is currently using a walker to ambulate.  Has a lift chair to assist with standing.  No recent falls.  Patient requires assistance with dressing, grooming and hygiene.   Palliative Care:  Explained services and patient is agreeable to continue.  Resources:  Home health is currently involved in patient care.   Home visit scheduled for tomorrow. 3/21 @ 1 pm.  CODE STATUS: Full code-Desires DNR ADVANCED DIRECTIVES: Yes MOST FORM: No PPS: 40%         Lorenza Burton, RN

## 2022-04-03 NOTE — Telephone Encounter (Signed)
Initial Palliative Care referral received from PCP.  Attempted to contact patient but line is busy.  Will try again at a later time.

## 2022-04-04 ENCOUNTER — Other Ambulatory Visit: Payer: Medicare PPO

## 2022-04-04 VITALS — BP 122/62 | HR 73 | Resp 18

## 2022-04-04 DIAGNOSIS — Z515 Encounter for palliative care: Secondary | ICD-10-CM

## 2022-04-10 NOTE — Progress Notes (Signed)
[facility] Initial Encounter Note   PATIENT NAME: Noah Boone DOB: 1929-12-03 MRN: [id number]  PRIMARY CARE PROVIDER: Adin Hector, MD  RESPONSIBLE PARTY:  Acct ID - Guarantor Home Phone Work Phone Relationship Acct Type  [id number] MINORU, [id number]  Self P/F     [address], [place] [address]   RN completed home visit. Becky caregiver also present    HISTORY OF PRESENT ILLNESS:   89 y.o. male with medical history significant of GERD, hypertension hyperlipidemia, stroke, recent fall, and arthritis.     Socially: Pt lives in [place] home.  Wife died in 05-05-06. Has lived in current home for 15 years. Caregiver- Jacqlyn Larsen comes in at 0800 to [place] next door, Dominica Severin checks in often. "Friend- Becky" comes and stays the night every night. Served for [employer] in [place].    Cognitive: Alert and oriented. Participates in conversation. Does have issues with speech. Talks slow.   Appetite: Caregiver and pt agree that pt is eating well. Pt eating three meals per day and snacks. Pt reports that he has issues swallowing. Takes small sips and swallows slowly.    Mobility: Pt ambulates in home by touching furniture and walls. Walked this past Thursday with PT and walked to the mailbox and back without walker. Does have rolling walker in home if needed. Last fall was a week ago, fell getting out of bed and injured right arm. Went to ED.    Sleeping Pattern: Reports that he sleeps 10 hours per night without issues. Also reports taking naps.   Pain: Denies any pain.   Palliative Care/ Hospice: RN explained role and purpose of palliative care including visit frequency. Also discussed benefits of hospice care as well as the differences between the two with patient.   Goals of Care: Wants to live at home independently for as long as possible.    CODE STATUS: DNR ADVANCED DIRECTIVES: N MOST FORM: No- blank form given to pt with explanation. PPS:    Next appt scheduled: 05/09/22-11a     PHYSICAL EXAM:   VITALS: Today's Vitals   04/04/22 1332  BP: 122/62  Pulse: 73  Resp: 18  SpO2: 95%  PainSc: 0-No pain    LUNGS: Clear bilat, no cough CARDIAC:  regular, no JVD EXTREMITIES: WNL, no edema   Jacqulyn Cane, RN

## 2022-04-23 DIAGNOSIS — R471 Dysarthria and anarthria: Secondary | ICD-10-CM

## 2022-05-09 ENCOUNTER — Other Ambulatory Visit: Payer: Medicare PPO

## 2022-05-09 VITALS — HR 97 | Temp 97.6°F

## 2022-05-09 DIAGNOSIS — Z515 Encounter for palliative care: Secondary | ICD-10-CM

## 2022-05-09 NOTE — Progress Notes (Signed)
PATIENT NAME: Noah Boone DOB: 11/29/1929 MRN: [id number]  PRIMARY CARE PROVIDER: Lynnea Ferrier, MD  RESPONSIBLE PARTY:  Acct ID - Guarantor Home Phone Work Phone Relationship Acct Type  [id number] [name], [name] [phone]  Self P/F     [address], [place], [place] [address]   Arrived at patient's home and visit started.  Discussed services involved and patient shared the file for [facility].  He is currently under hospice support and has been for about 4 weeks now.  Palliative Care services discharged.          PHYSICAL EXAM:   VITALS: Today's Vitals   05/09/22 1138  Pulse: 97  Temp: 97.6 F (36.4 C)  SpO2: 95%          Truitt Merle, RN
